# Patient Record
Sex: Female | Born: 1950 | Race: Black or African American | Hispanic: No | Marital: Single | Smoking: Current every day smoker
Health system: Southern US, Community
[De-identification: ages and names within clinical notes are randomized; demographics above are authoritative.]

## PROBLEM LIST (undated history)

## (undated) ENCOUNTER — Emergency Department (HOSPITAL_COMMUNITY): Discharge status: Absent without leave | Payer: Medicare Other

## (undated) DIAGNOSIS — C801 Malignant (primary) neoplasm, unspecified: Secondary | ICD-10-CM

## (undated) DIAGNOSIS — I219 Acute myocardial infarction, unspecified: Secondary | ICD-10-CM

## (undated) HISTORY — PX: CORONARY ANGIOPLASTY WITH STENT PLACEMENT: SHX49

## (undated) HISTORY — PX: ROTATOR CUFF REPAIR: SHX139

## (undated) HISTORY — PX: CARDIAC CATHETERIZATION: SHX172

---

## 1997-12-06 ENCOUNTER — Ambulatory Visit (HOSPITAL_COMMUNITY)
Admission: RE | Admit: 1997-12-06 | Discharge: 1997-12-07 | Payer: Self-pay | Admitting: Thoracic Surgery (Cardiothoracic Vascular Surgery)

## 1997-12-12 ENCOUNTER — Inpatient Hospital Stay (HOSPITAL_COMMUNITY)
Admission: RE | Admit: 1997-12-12 | Discharge: 1997-12-17 | Payer: Self-pay | Admitting: Thoracic Surgery (Cardiothoracic Vascular Surgery)

## 1997-12-17 ENCOUNTER — Inpatient Hospital Stay (HOSPITAL_COMMUNITY): Admission: EM | Admit: 1997-12-17 | Discharge: 1997-12-20 | Payer: Self-pay | Admitting: Emergency Medicine

## 1998-02-06 ENCOUNTER — Emergency Department (HOSPITAL_COMMUNITY): Admission: EM | Admit: 1998-02-06 | Discharge: 1998-02-06 | Payer: Self-pay | Admitting: Emergency Medicine

## 1998-04-01 ENCOUNTER — Ambulatory Visit (HOSPITAL_COMMUNITY)
Admission: RE | Admit: 1998-04-01 | Discharge: 1998-04-01 | Payer: Self-pay | Admitting: Thoracic Surgery (Cardiothoracic Vascular Surgery)

## 1998-05-12 ENCOUNTER — Emergency Department (HOSPITAL_COMMUNITY): Admission: EM | Admit: 1998-05-12 | Discharge: 1998-05-12 | Payer: Self-pay | Admitting: Emergency Medicine

## 1998-06-24 ENCOUNTER — Encounter: Payer: Self-pay | Admitting: *Deleted

## 1998-06-24 ENCOUNTER — Emergency Department (HOSPITAL_COMMUNITY): Admission: EM | Admit: 1998-06-24 | Discharge: 1998-06-24 | Payer: Self-pay | Admitting: *Deleted

## 1998-07-29 ENCOUNTER — Ambulatory Visit (HOSPITAL_BASED_OUTPATIENT_CLINIC_OR_DEPARTMENT_OTHER): Admission: RE | Admit: 1998-07-29 | Discharge: 1998-07-29 | Payer: Self-pay | Admitting: Orthopedic Surgery

## 1999-02-26 ENCOUNTER — Emergency Department (HOSPITAL_COMMUNITY): Admission: EM | Admit: 1999-02-26 | Discharge: 1999-02-26 | Payer: Self-pay | Admitting: Emergency Medicine

## 1999-02-27 ENCOUNTER — Encounter: Payer: Self-pay | Admitting: Emergency Medicine

## 1999-09-13 ENCOUNTER — Emergency Department (HOSPITAL_COMMUNITY): Admission: EM | Admit: 1999-09-13 | Discharge: 1999-09-13 | Payer: Self-pay | Admitting: Emergency Medicine

## 1999-09-13 ENCOUNTER — Encounter: Payer: Self-pay | Admitting: Emergency Medicine

## 1999-09-25 ENCOUNTER — Encounter: Admission: RE | Admit: 1999-09-25 | Discharge: 1999-09-25 | Payer: Self-pay | Admitting: Internal Medicine

## 1999-09-25 ENCOUNTER — Encounter: Payer: Self-pay | Admitting: Internal Medicine

## 2000-02-03 ENCOUNTER — Inpatient Hospital Stay (HOSPITAL_COMMUNITY): Admission: EM | Admit: 2000-02-03 | Discharge: 2000-02-05 | Payer: Self-pay | Admitting: Emergency Medicine

## 2000-02-03 ENCOUNTER — Encounter: Payer: Self-pay | Admitting: Emergency Medicine

## 2000-04-27 ENCOUNTER — Encounter: Payer: Self-pay | Admitting: *Deleted

## 2000-04-27 ENCOUNTER — Ambulatory Visit (HOSPITAL_COMMUNITY): Admission: RE | Admit: 2000-04-27 | Discharge: 2000-04-28 | Payer: Self-pay | Admitting: *Deleted

## 2000-05-11 ENCOUNTER — Encounter (HOSPITAL_COMMUNITY): Admission: RE | Admit: 2000-05-11 | Discharge: 2000-08-09 | Payer: Self-pay | Admitting: *Deleted

## 2000-09-01 ENCOUNTER — Encounter: Payer: Self-pay | Admitting: Emergency Medicine

## 2000-09-01 ENCOUNTER — Inpatient Hospital Stay (HOSPITAL_COMMUNITY): Admission: EM | Admit: 2000-09-01 | Discharge: 2000-09-03 | Payer: Self-pay

## 2000-09-01 ENCOUNTER — Encounter: Payer: Self-pay | Admitting: Internal Medicine

## 2000-09-02 ENCOUNTER — Encounter: Payer: Self-pay | Admitting: Internal Medicine

## 2000-09-28 ENCOUNTER — Other Ambulatory Visit: Admission: RE | Admit: 2000-09-28 | Discharge: 2000-09-28 | Payer: Self-pay | Admitting: Family Medicine

## 2001-01-17 ENCOUNTER — Inpatient Hospital Stay (HOSPITAL_COMMUNITY): Admission: EM | Admit: 2001-01-17 | Discharge: 2001-01-20 | Payer: Self-pay | Admitting: Emergency Medicine

## 2001-01-17 ENCOUNTER — Encounter: Payer: Self-pay | Admitting: Emergency Medicine

## 2001-01-18 ENCOUNTER — Encounter: Payer: Self-pay | Admitting: Cardiovascular Disease

## 2001-10-29 ENCOUNTER — Encounter: Payer: Self-pay | Admitting: Internal Medicine

## 2001-10-29 ENCOUNTER — Inpatient Hospital Stay (HOSPITAL_COMMUNITY): Admission: EM | Admit: 2001-10-29 | Discharge: 2001-11-01 | Payer: Self-pay

## 2001-10-30 ENCOUNTER — Encounter: Payer: Self-pay | Admitting: *Deleted

## 2001-10-31 ENCOUNTER — Encounter: Payer: Self-pay | Admitting: *Deleted

## 2002-11-05 ENCOUNTER — Encounter: Payer: Self-pay | Admitting: Cardiology

## 2002-11-05 ENCOUNTER — Inpatient Hospital Stay (HOSPITAL_COMMUNITY): Admission: EM | Admit: 2002-11-05 | Discharge: 2002-11-07 | Payer: Self-pay

## 2002-11-06 ENCOUNTER — Encounter (INDEPENDENT_AMBULATORY_CARE_PROVIDER_SITE_OTHER): Payer: Self-pay | Admitting: Cardiology

## 2003-07-17 ENCOUNTER — Ambulatory Visit (HOSPITAL_BASED_OUTPATIENT_CLINIC_OR_DEPARTMENT_OTHER): Admission: RE | Admit: 2003-07-17 | Discharge: 2003-07-17 | Payer: Self-pay | Admitting: Orthopedic Surgery

## 2003-07-17 ENCOUNTER — Ambulatory Visit (HOSPITAL_COMMUNITY): Admission: RE | Admit: 2003-07-17 | Discharge: 2003-07-17 | Payer: Self-pay | Admitting: Orthopedic Surgery

## 2004-08-11 ENCOUNTER — Inpatient Hospital Stay (HOSPITAL_COMMUNITY): Admission: EM | Admit: 2004-08-11 | Discharge: 2004-08-13 | Payer: Self-pay | Admitting: Emergency Medicine

## 2005-04-14 ENCOUNTER — Ambulatory Visit (HOSPITAL_BASED_OUTPATIENT_CLINIC_OR_DEPARTMENT_OTHER): Admission: RE | Admit: 2005-04-14 | Discharge: 2005-04-14 | Payer: Self-pay | Admitting: *Deleted

## 2005-04-19 ENCOUNTER — Ambulatory Visit: Payer: Self-pay | Admitting: Internal Medicine

## 2005-04-20 ENCOUNTER — Inpatient Hospital Stay (HOSPITAL_COMMUNITY): Admission: EM | Admit: 2005-04-20 | Discharge: 2005-04-21 | Payer: Self-pay | Admitting: Emergency Medicine

## 2005-12-29 ENCOUNTER — Emergency Department (HOSPITAL_COMMUNITY): Admission: EM | Admit: 2005-12-29 | Discharge: 2005-12-29 | Payer: Self-pay | Admitting: Family Medicine

## 2007-04-06 ENCOUNTER — Emergency Department (HOSPITAL_COMMUNITY): Admission: EM | Admit: 2007-04-06 | Discharge: 2007-04-06 | Payer: Self-pay | Admitting: Emergency Medicine

## 2008-08-31 ENCOUNTER — Emergency Department (HOSPITAL_COMMUNITY): Admission: EM | Admit: 2008-08-31 | Discharge: 2008-09-01 | Payer: Self-pay | Admitting: Emergency Medicine

## 2008-12-07 ENCOUNTER — Inpatient Hospital Stay (HOSPITAL_COMMUNITY): Admission: EM | Admit: 2008-12-07 | Discharge: 2008-12-11 | Payer: Self-pay | Admitting: Emergency Medicine

## 2008-12-07 ENCOUNTER — Ambulatory Visit: Payer: Self-pay | Admitting: Internal Medicine

## 2008-12-08 ENCOUNTER — Ambulatory Visit: Payer: Self-pay | Admitting: *Deleted

## 2008-12-22 ENCOUNTER — Emergency Department (HOSPITAL_COMMUNITY): Admission: EM | Admit: 2008-12-22 | Discharge: 2008-12-22 | Payer: Self-pay | Admitting: Emergency Medicine

## 2009-01-09 ENCOUNTER — Ambulatory Visit: Payer: Self-pay | Admitting: Internal Medicine

## 2009-01-09 ENCOUNTER — Inpatient Hospital Stay (HOSPITAL_COMMUNITY): Admission: EM | Admit: 2009-01-09 | Discharge: 2009-01-15 | Payer: Self-pay | Admitting: Emergency Medicine

## 2009-03-21 ENCOUNTER — Emergency Department (HOSPITAL_COMMUNITY): Admission: EM | Admit: 2009-03-21 | Discharge: 2009-03-21 | Payer: Self-pay | Admitting: Emergency Medicine

## 2010-04-02 IMAGING — CT CT HEAD W/O CM
1 series · 16 of 30 positions shown, 20 images · non-contrast
Comparison: None

CLINICAL DATA: Difficult to arouse status post extubation

CT HEAD WITHOUT CONTRAST
TECHNIQUE: Contiguous axial images were obtained from the base of
the skull through the vertex without contrast.

[Series 2: head routine 4.8 h37s · axial · 0.46mm/px · z∈[-91,+74]mm · 16 of 36 slices shown, 20 images]
[im 2/36  brain]
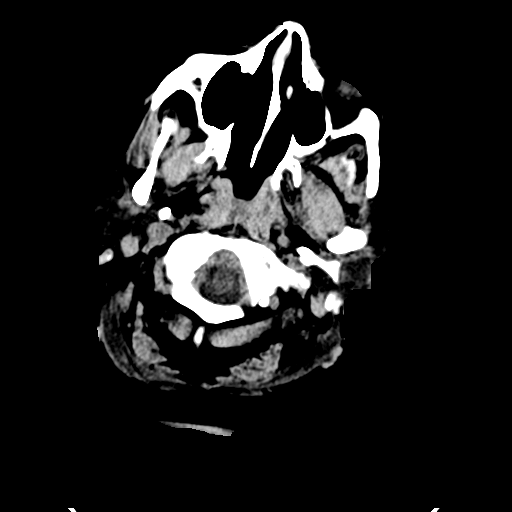
[im 2/36  bone]
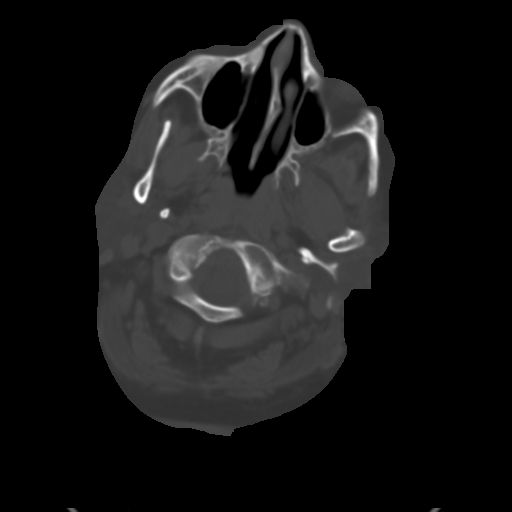
[im 4/36  brain]
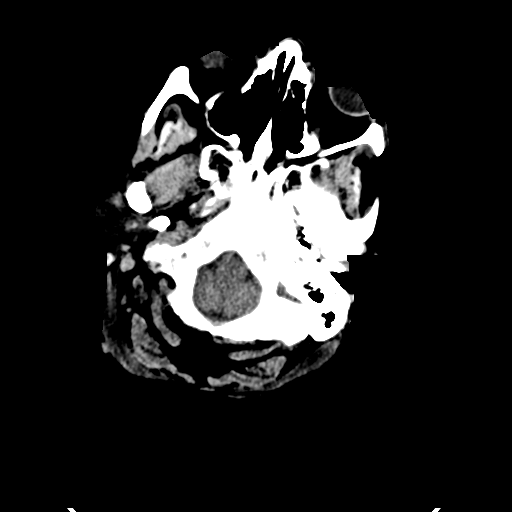
[im 7/36  brain]
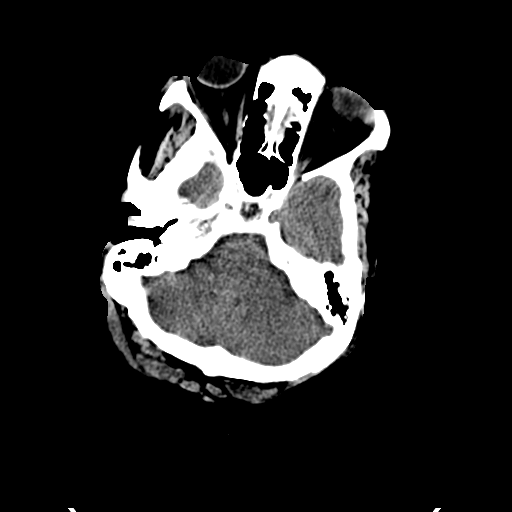
[im 9/36  brain]
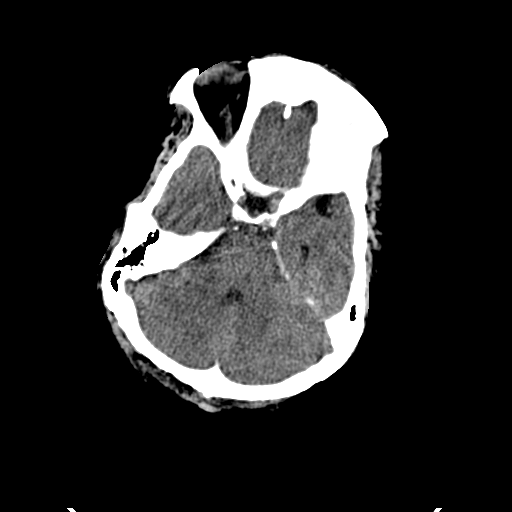
[im 10/36  brain]
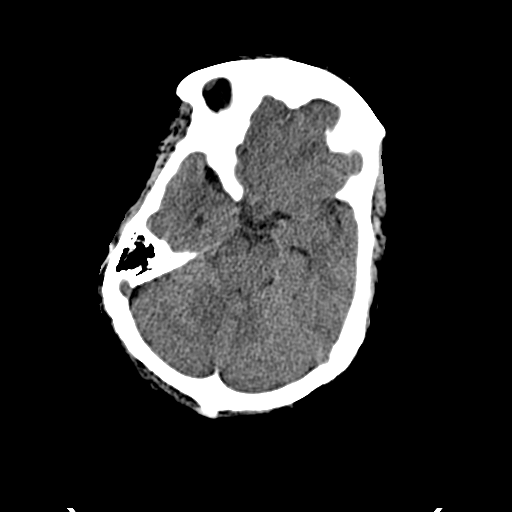
[im 10/36  bone]
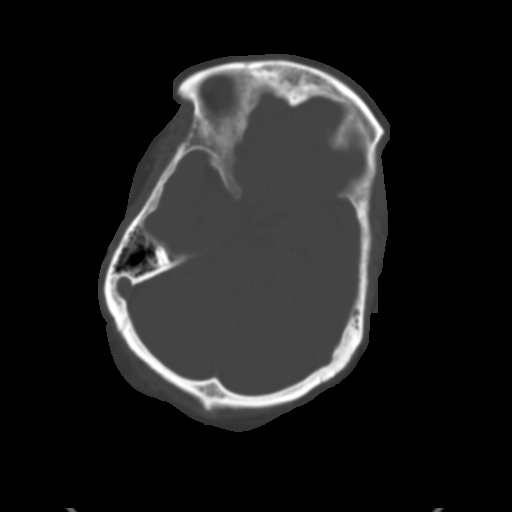
[im 13/36  brain]
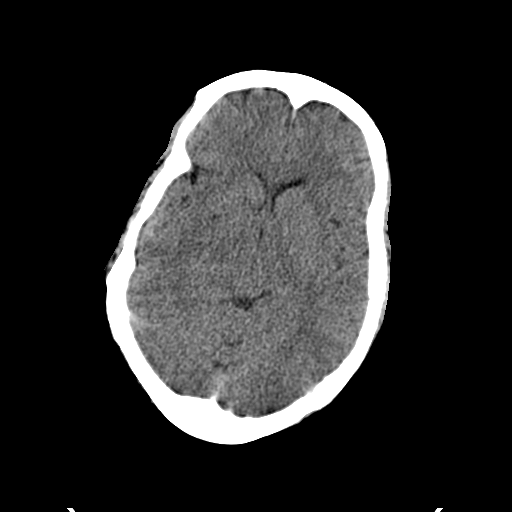
[im 15/36  brain]
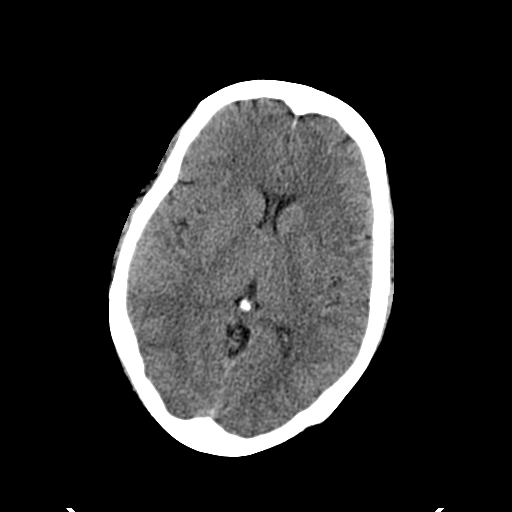
[im 17/36  brain]
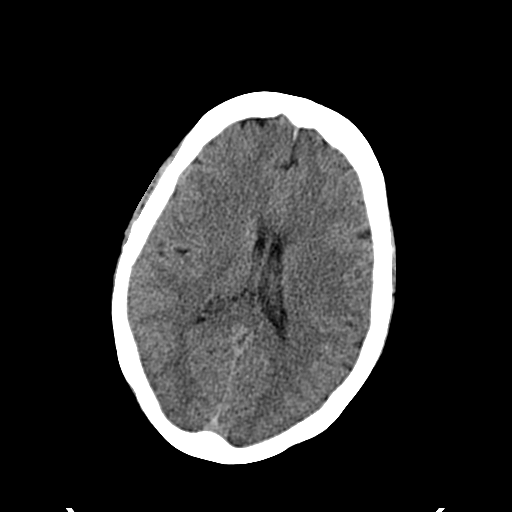
[im 19/36  brain]
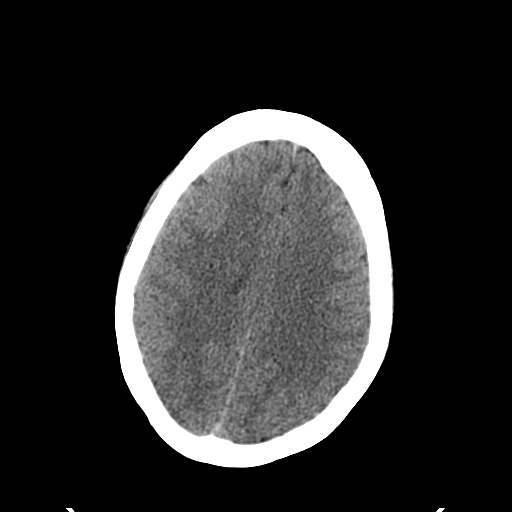
[im 19/36  bone]
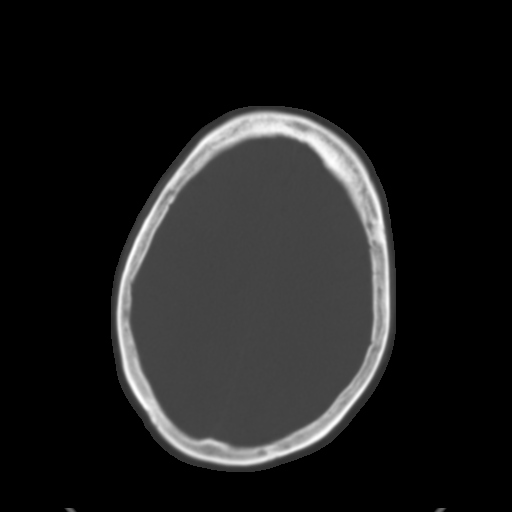
[im 21/36  brain]
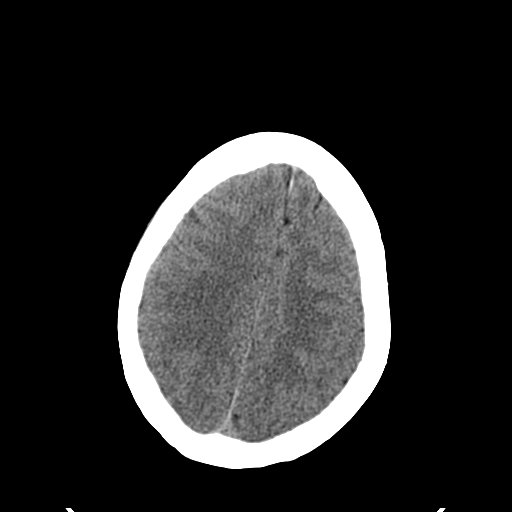
[im 23/36  brain]
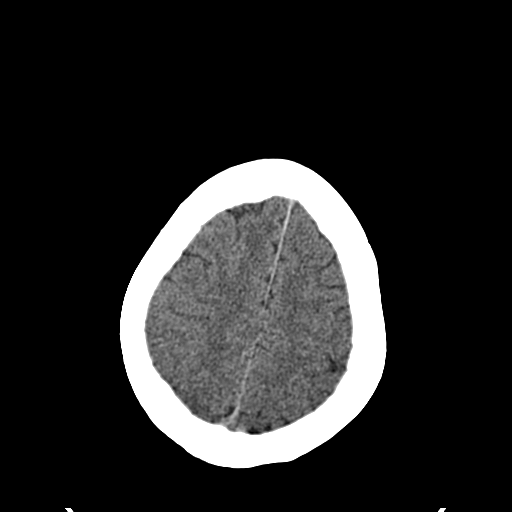
[im 26/36  brain]
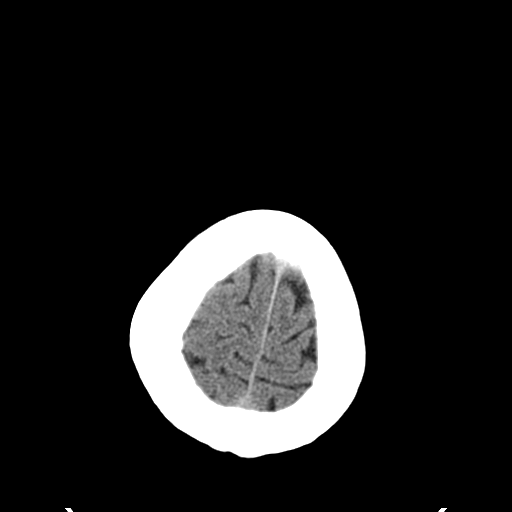
[im 27/36  brain]
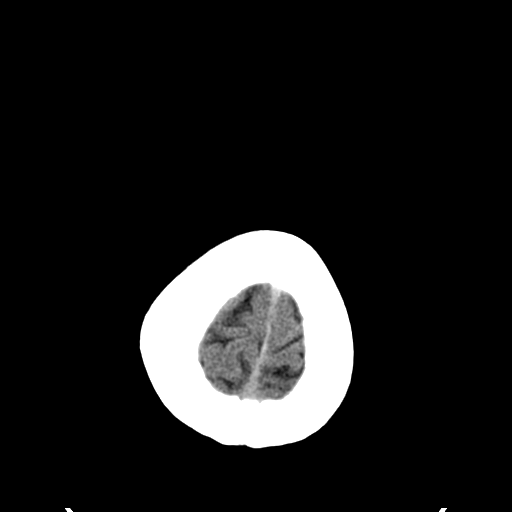
[im 27/36  bone]
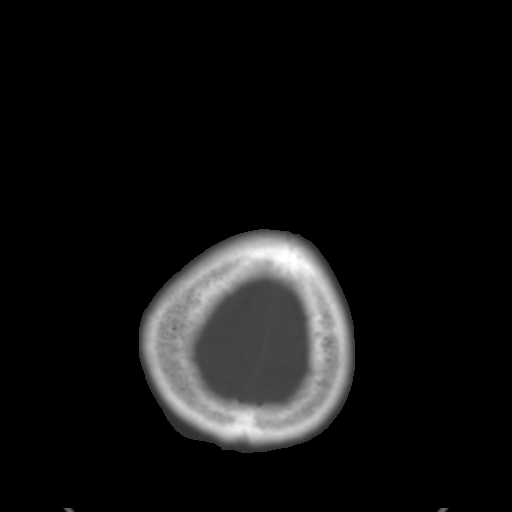
[im 29/36  brain]
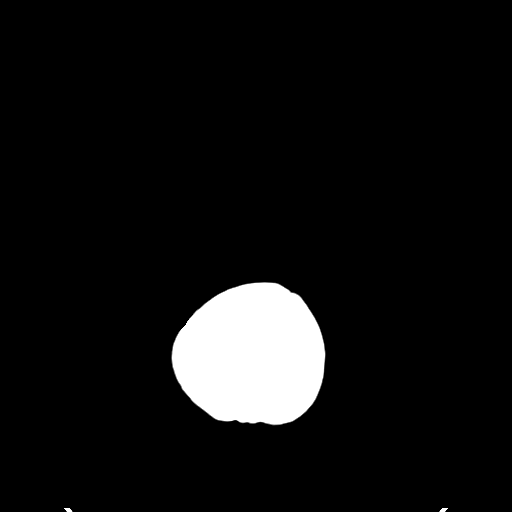
[im 32/36  brain]
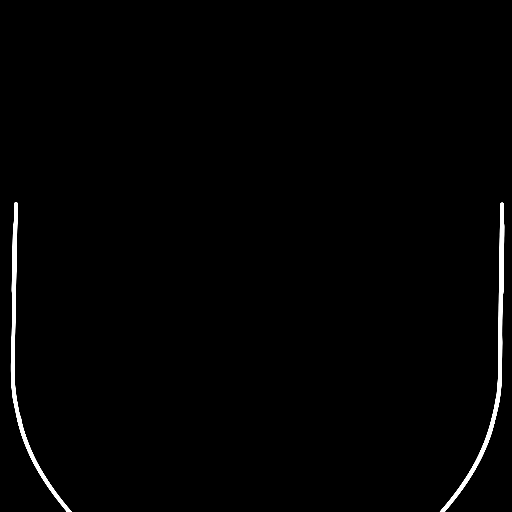
[im 34/36  brain]
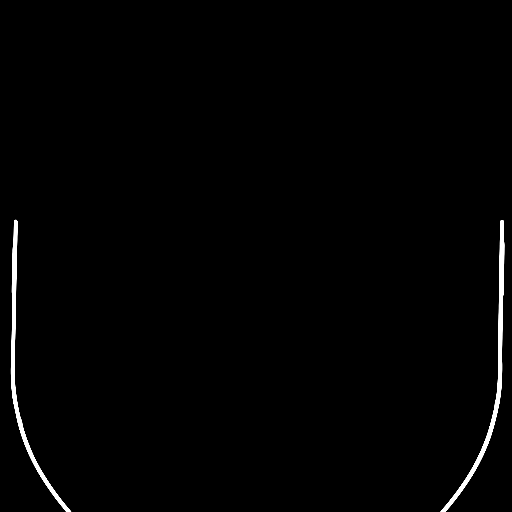

[16 of 30 positions shown; findings below may reference images not displayed]

FINDINGS: No extra-axial fluid collections or intraparenchymal
hemorrhage.  No midline shift.  No mass lesion.  No CT evidence of
acute infarction.  No hydrocephalous.  Basilar cisterns are patent.

Paranasal sinuses and mastoid air cells are clear.  Orbits appear
normal.
IMPRESSION: 1.  No acute intracranial process.

Critical test results telephoned to [REDACTED], RN at the time of
interpretation on 01/10/2009 at [DATE].

## 2010-04-04 IMAGING — CR DG CHEST 1V PORT
1 series · 1 of 1 positions shown · non-contrast
Comparison: 01/11/2009

CLINICAL DATA: Fever, rales, smoker.

PORTABLE CHEST - 1 VIEW

[view not recorded]
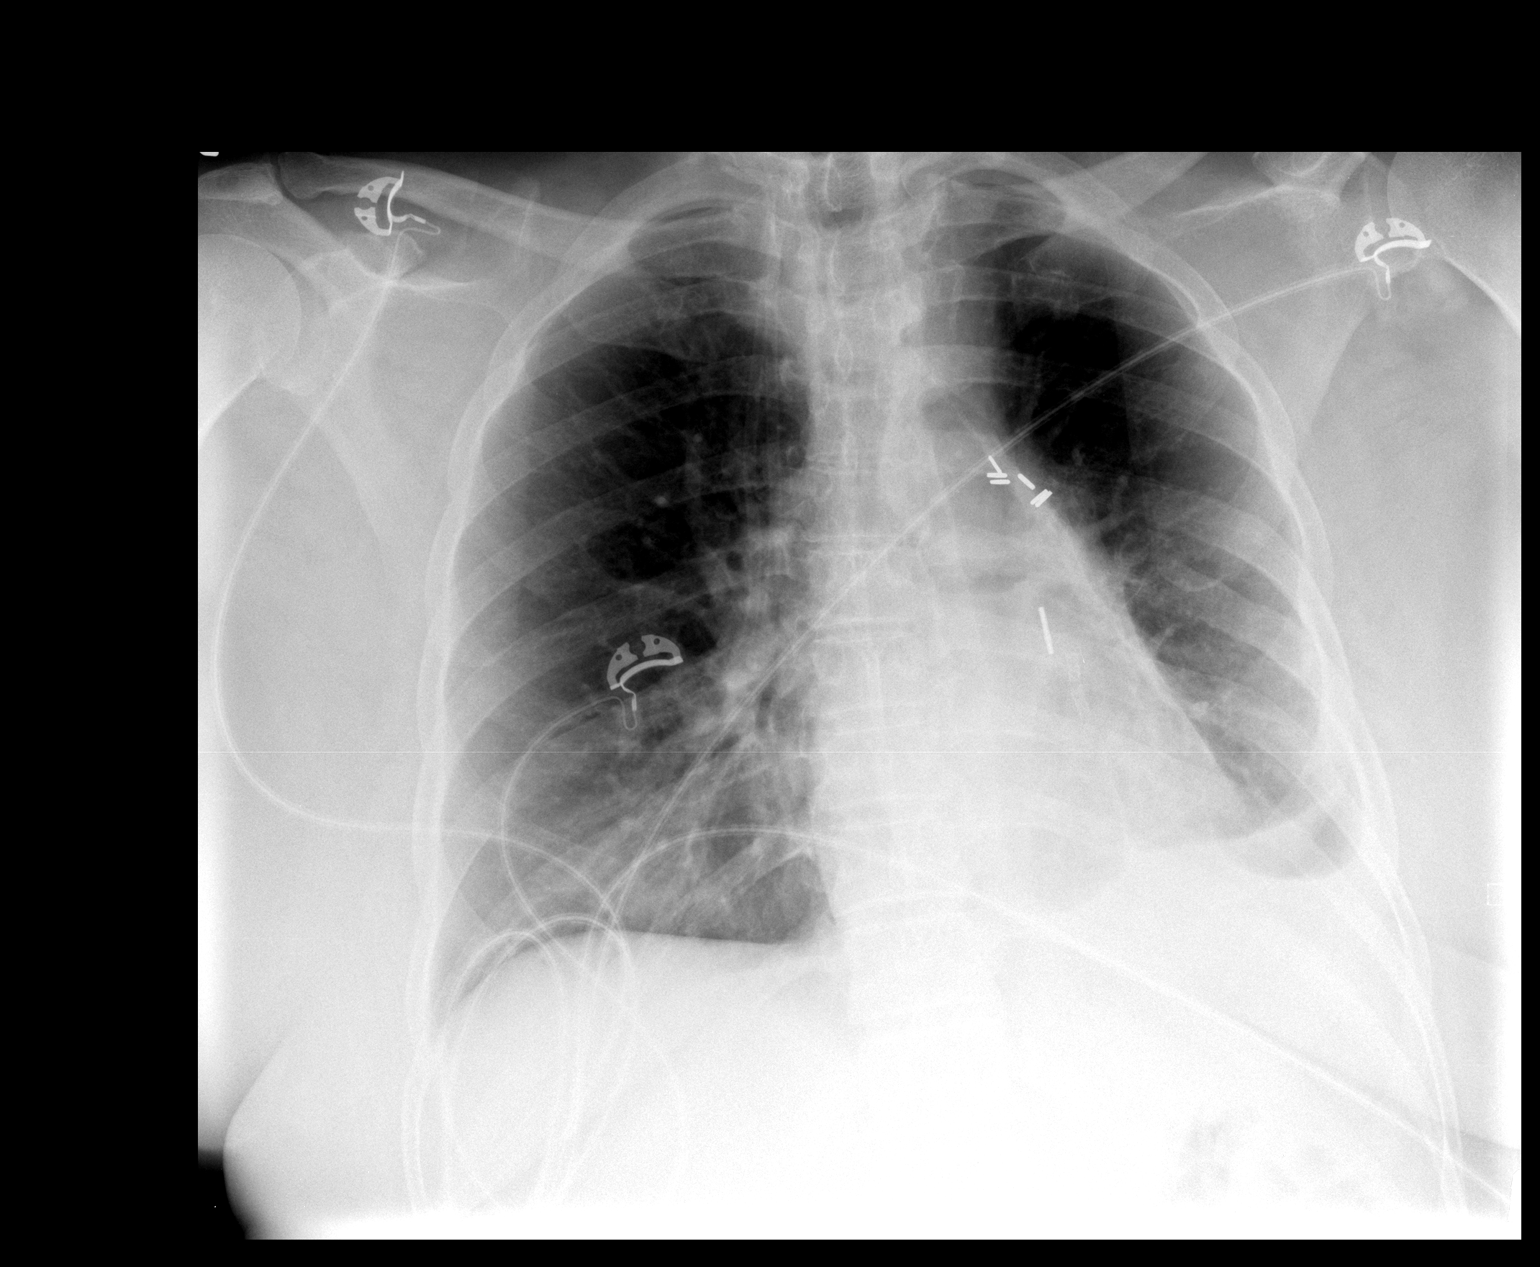

[1 of 1 positions shown; findings below may reference images not displayed]

FINDINGS: The cardiac silhouette, mediastinal and hilar contours
are stable.  There is a persistent small left effusion with
overlying atelectasis.  Persistent streaky right lower lobe
atelectasis.  No pulmonary edema.
IMPRESSION: 1.  Persistent small left effusion with overlying atelectasis.
2.  Persistent streaky right basilar atelectasis.  Slight overall
improved perihilar aeration.

## 2010-04-06 IMAGING — CR DG CHEST 2V
2 series · 2 of 2 positions shown · non-contrast
Comparison: 01/22/2009

CLINICAL DATA: Chest tightness/pneumonia

CHEST - 2 VIEW

[w chest pa]
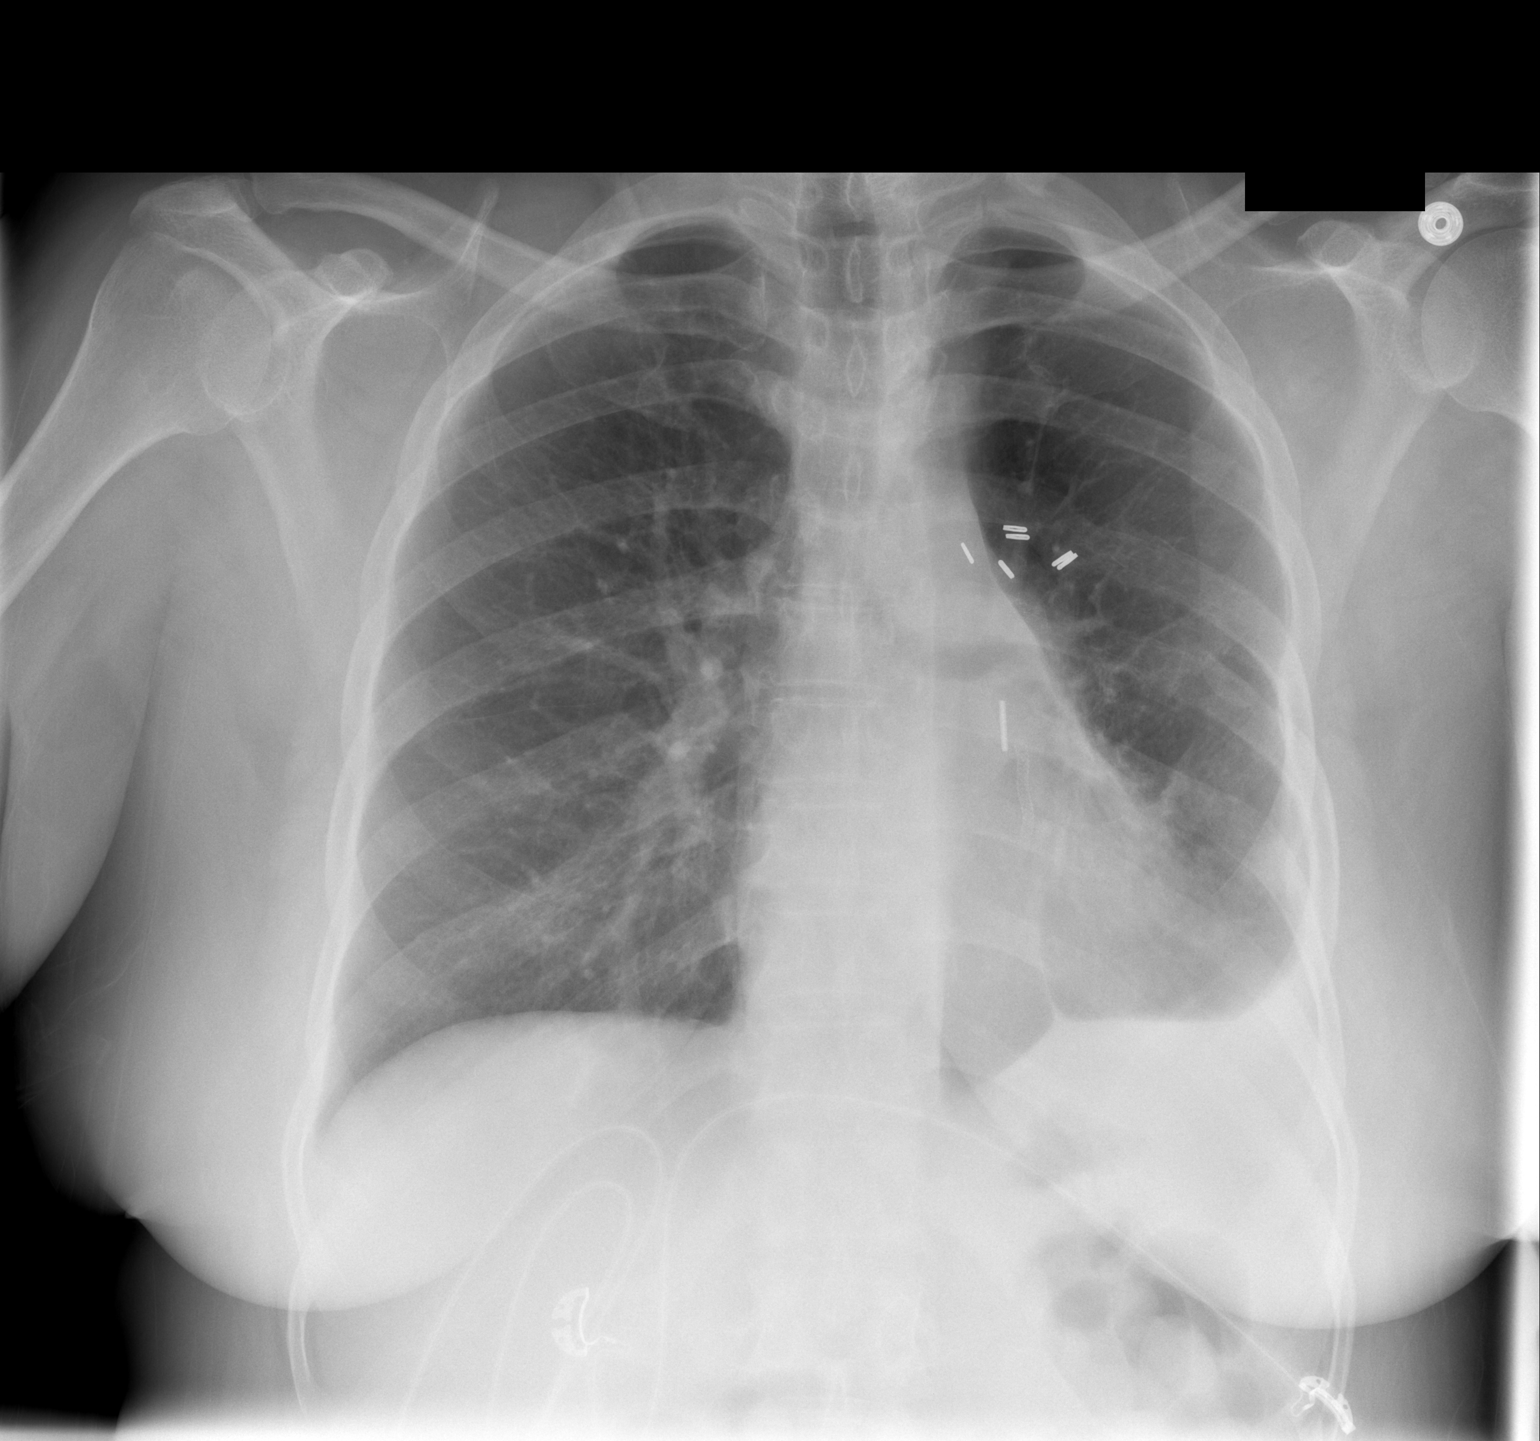

[w chest lat]
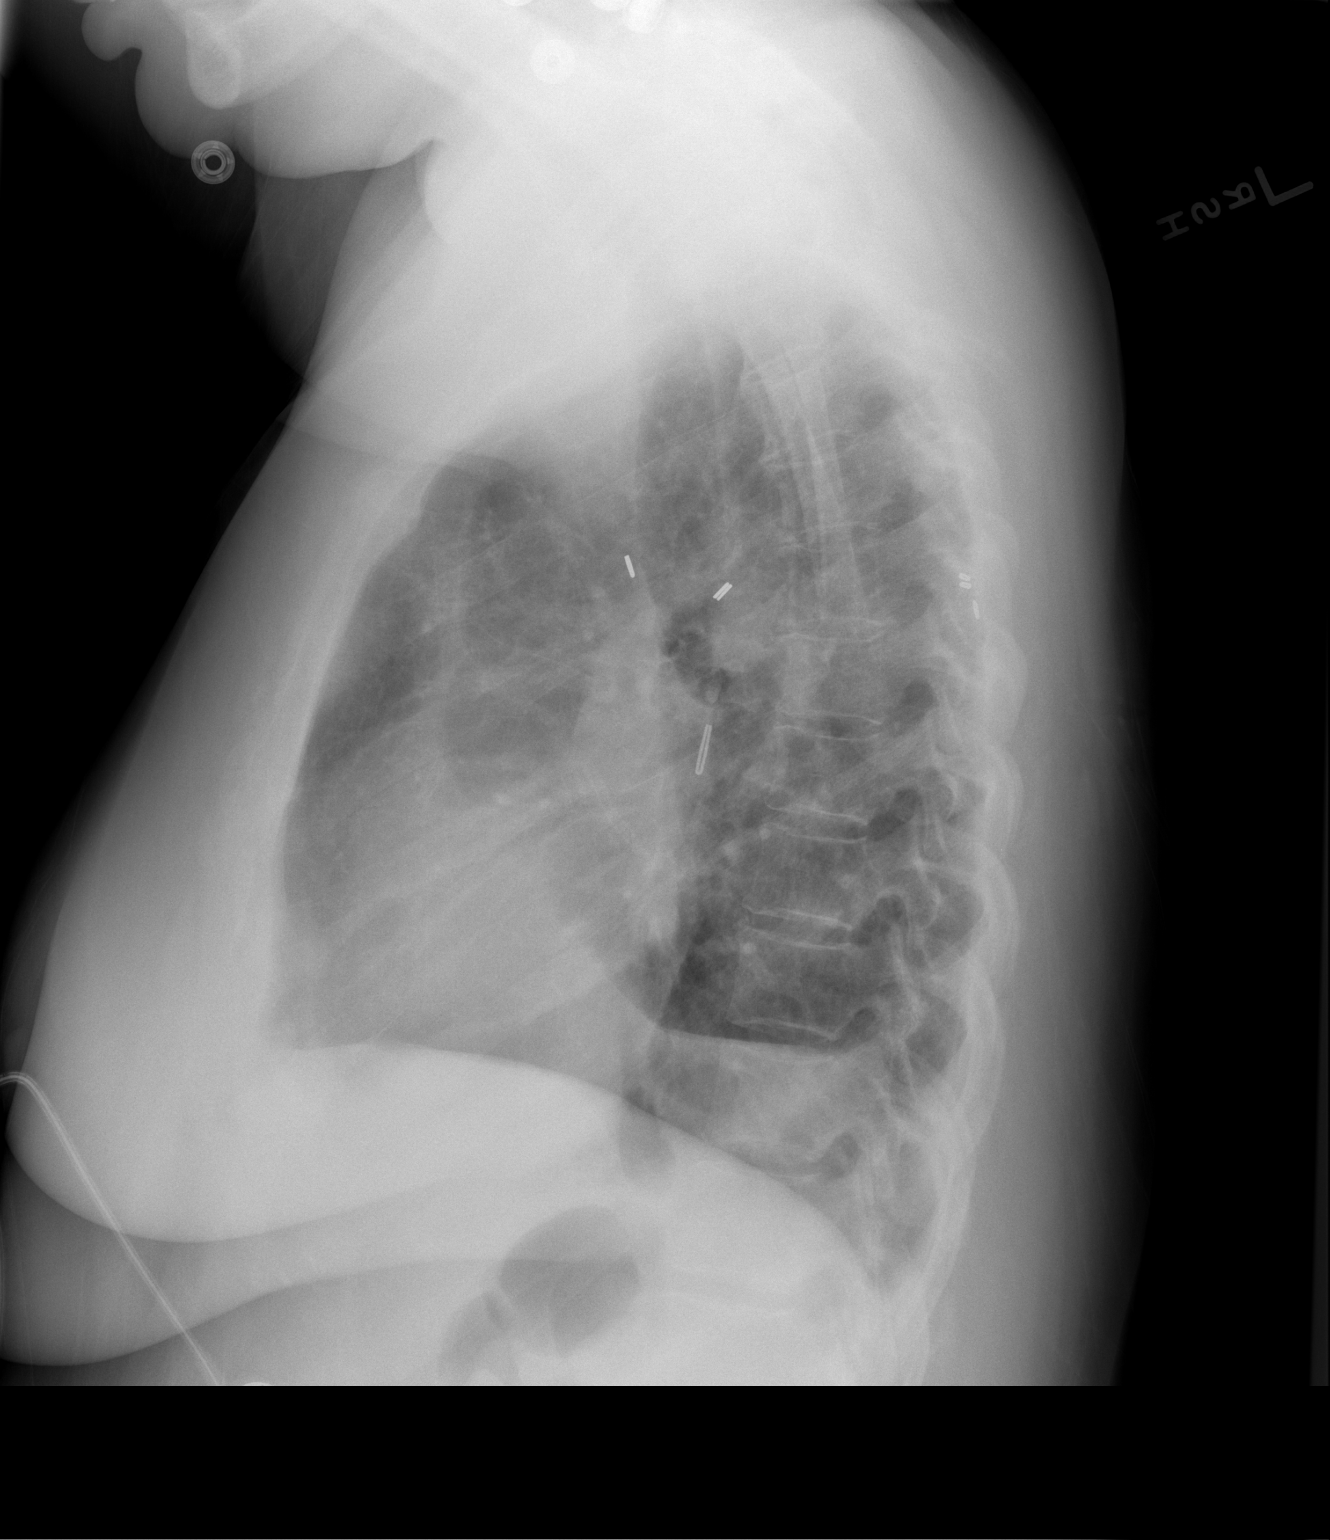

[2 of 2 positions shown; findings below may reference images not displayed]

FINDINGS: Persistent left pleural effusion without definite
pneumonia or heart failure. There is minimal atelectasis or scar at
the left lung base. Coronary arteries stents noted.  Heart size
normal without congestive heart failure.
IMPRESSION: 1.  Persistent left pleural effusion.
2.  No definite active pneumonia or heart failure.

## 2010-12-16 LAB — DIFFERENTIAL
Basophils Absolute: 0 10*3/uL (ref 0.0–0.1)
Basophils Absolute: 0.1 10*3/uL (ref 0.0–0.1)
Basophils Relative: 0 % (ref 0–1)
Eosinophils Absolute: 0.1 10*3/uL (ref 0.0–0.7)
Lymphs Abs: 3.8 10*3/uL (ref 0.7–4.0)
Monocytes Absolute: 0.5 10*3/uL (ref 0.1–1.0)
Monocytes Relative: 6 % (ref 3–12)
Monocytes Relative: 9 % (ref 3–12)
Neutrophils Relative %: 52 % (ref 43–77)
Neutrophils Relative %: 71 % (ref 43–77)

## 2010-12-16 LAB — BASIC METABOLIC PANEL
BUN: 7 mg/dL (ref 6–23)
CO2: 20 mEq/L (ref 19–32)
CO2: 22 mEq/L (ref 19–32)
CO2: 25 mEq/L (ref 19–32)
CO2: 28 mEq/L (ref 19–32)
Calcium: 8.4 mg/dL (ref 8.4–10.5)
Calcium: 8.6 mg/dL (ref 8.4–10.5)
Chloride: 102 mEq/L (ref 96–112)
Chloride: 108 mEq/L (ref 96–112)
Creatinine, Ser: 0.64 mg/dL (ref 0.4–1.2)
GFR calc Af Amer: >60 mL/min (ref >60)
GFR calc Af Amer: >60 mL/min (ref >60)
GFR calc Af Amer: >60 mL/min (ref >60)
GFR calc non Af Amer: >60 mL/min (ref >60)
GFR calc non Af Amer: >60 mL/min (ref >60)
GFR calc non Af Amer: >60 mL/min (ref >60)
Glucose, Bld: 107 mg/dL — ABNORMAL HIGH (ref 70–99)
Glucose, Bld: 113 mg/dL — ABNORMAL HIGH (ref 70–99)
Glucose, Bld: 114 mg/dL — ABNORMAL HIGH (ref 70–99)
Potassium: 3.3 mEq/L — ABNORMAL LOW (ref 3.5–5.1)
Potassium: 3.3 mEq/L — ABNORMAL LOW (ref 3.5–5.1)
Potassium: 3.7 mEq/L (ref 3.5–5.1)
Sodium: 135 mEq/L (ref 135–145)
Sodium: 138 mEq/L (ref 135–145)
Sodium: 139 mEq/L (ref 135–145)
Sodium: 139 mEq/L (ref 135–145)

## 2010-12-16 LAB — CBC
HCT: 30.1 % — ABNORMAL LOW (ref 36.0–46.0)
HCT: 33.5 % — ABNORMAL LOW (ref 36.0–46.0)
HCT: 36.1 % (ref 36.0–46.0)
Hemoglobin: 10.6 g/dL — ABNORMAL LOW (ref 12.0–15.0)
Hemoglobin: 11.1 g/dL — ABNORMAL LOW (ref 12.0–15.0)
Hemoglobin: 11.7 g/dL — ABNORMAL LOW (ref 12.0–15.0)
Hemoglobin: 12 g/dL (ref 12.0–15.0)
MCHC: 34.6 g/dL (ref 30.0–36.0)
MCHC: 34.7 g/dL (ref 30.0–36.0)
MCHC: 34.8 g/dL (ref 30.0–36.0)
MCHC: 34.8 g/dL (ref 30.0–36.0)
MCHC: 35.2 g/dL (ref 30.0–36.0)
MCV: 95.1 fL (ref 78.0–100.0)
MCV: 95.2 fL (ref 78.0–100.0)
MCV: 95.2 fL (ref 78.0–100.0)
MCV: 95.4 fL (ref 78.0–100.0)
MCV: 96 fL (ref 78.0–100.0)
Platelets: 220 10*3/uL (ref 150–400)
Platelets: 238 10*3/uL (ref 150–400)
Platelets: 352 10*3/uL (ref 150–400)
Platelets: 387 10*3/uL (ref 150–400)
RBC: 3.34 MIL/uL — ABNORMAL LOW (ref 3.87–5.11)
RBC: 3.49 MIL/uL — ABNORMAL LOW (ref 3.87–5.11)
RBC: 3.51 MIL/uL — ABNORMAL LOW (ref 3.87–5.11)
RBC: 3.78 MIL/uL — ABNORMAL LOW (ref 3.87–5.11)
RDW: 13.3 % (ref 11.5–15.5)
RDW: 13.4 % (ref 11.5–15.5)
RDW: 13.5 % (ref 11.5–15.5)
RDW: 13.5 % (ref 11.5–15.5)
RDW: 13.6 % (ref 11.5–15.5)
RDW: 13.8 % (ref 11.5–15.5)
WBC: 8.2 10*3/uL (ref 4.0–10.5)

## 2010-12-16 LAB — POCT I-STAT 3, ART BLOOD GAS (G3+)
Acid-base deficit: 3 mmol/L — ABNORMAL HIGH (ref 0.0–2.0)
Acid-base deficit: 5 mmol/L — ABNORMAL HIGH (ref 0.0–2.0)
Acid-base deficit: 5 mmol/L — ABNORMAL HIGH (ref 0.0–2.0)
Bicarbonate: 20.7 mEq/L (ref 20.0–24.0)
Bicarbonate: 21.3 mEq/L (ref 20.0–24.0)
Bicarbonate: 21.5 mEq/L (ref 20.0–24.0)
O2 Saturation: 100 %
O2 Saturation: 98 %
Patient temperature: 98
Patient temperature: 98
Patient temperature: 99.2
Patient temperature: 99.3
TCO2: 22 mmol/L (ref 0–100)
TCO2: 24 mmol/L (ref 0–100)
pCO2 arterial: 35.7 mmHg (ref 35.0–45.0)
pH, Arterial: 7.27 — ABNORMAL LOW (ref 7.350–7.400)
pH, Arterial: 7.385 (ref 7.350–7.400)
pH, Arterial: 7.385 (ref 7.350–7.400)
pO2, Arterial: 110 mmHg — ABNORMAL HIGH (ref 80.0–100.0)

## 2010-12-16 LAB — TROPONIN I
Troponin I: 0.58 ng/mL (ref 0.00–0.06)
Troponin I: 12.17 ng/mL (ref 0.00–0.06)

## 2010-12-16 LAB — CARDIAC PANEL(CRET KIN+CKTOT+MB+TROPI)
CK, MB: 2.6 ng/mL (ref 0.3–4.0)
Total CK: 137 U/L (ref 7–177)
Total CK: 45 U/L (ref 7–177)
Total CK: 45 U/L (ref 7–177)

## 2010-12-16 LAB — CK TOTAL AND CKMB (NOT AT ARMC)
CK, MB: 143.6 ng/mL — ABNORMAL HIGH (ref 0.3–4.0)
CK, MB: 8.1 ng/mL — ABNORMAL HIGH (ref 0.3–4.0)
Relative Index: 20.4 — ABNORMAL HIGH (ref 0.0–2.5)
Relative Index: 20.5 — ABNORMAL HIGH (ref 0.0–2.5)
Total CK: 75 U/L (ref 7–177)

## 2010-12-16 LAB — COMPREHENSIVE METABOLIC PANEL
AST: 21 U/L (ref 0–37)
Albumin: 3.3 g/dL — ABNORMAL LOW (ref 3.5–5.2)
BUN: 5 mg/dL — ABNORMAL LOW (ref 6–23)
Chloride: 109 mEq/L (ref 96–112)
Chloride: 110 mEq/L (ref 96–112)
Creatinine, Ser: 0.71 mg/dL (ref 0.4–1.2)
GFR calc Af Amer: >60 mL/min (ref >60)
GFR calc non Af Amer: >60 mL/min (ref >60)
Potassium: 3.2 mEq/L — ABNORMAL LOW (ref 3.5–5.1)
Sodium: 140 mEq/L (ref 135–145)
Total Bilirubin: 0.5 mg/dL (ref 0.3–1.2)
Total Bilirubin: 0.9 mg/dL (ref 0.3–1.2)
Total Protein: 6 g/dL (ref 6.0–8.3)

## 2010-12-16 LAB — WET PREP, GENITAL: Trich, Wet Prep: NONE SEEN

## 2010-12-16 LAB — GLUCOSE, CAPILLARY
Glucose-Capillary: 103 mg/dL — ABNORMAL HIGH (ref 70–99)
Glucose-Capillary: 106 mg/dL — ABNORMAL HIGH (ref 70–99)
Glucose-Capillary: 113 mg/dL — ABNORMAL HIGH (ref 70–99)
Glucose-Capillary: 119 mg/dL — ABNORMAL HIGH (ref 70–99)
Glucose-Capillary: 163 mg/dL — ABNORMAL HIGH (ref 70–99)
Glucose-Capillary: 203 mg/dL — ABNORMAL HIGH (ref 70–99)
Glucose-Capillary: 82 mg/dL (ref 70–99)
Glucose-Capillary: 87 mg/dL (ref 70–99)
Glucose-Capillary: 88 mg/dL (ref 70–99)
Glucose-Capillary: 97 mg/dL (ref 70–99)
Glucose-Capillary: 98 mg/dL (ref 70–99)
Glucose-Capillary: 98 mg/dL (ref 70–99)

## 2010-12-16 LAB — CULTURE, BLOOD (ROUTINE X 2): Culture: NO GROWTH

## 2010-12-16 LAB — URINE CULTURE: Colony Count: >=100000

## 2010-12-16 LAB — PROTIME-INR: Prothrombin Time: 13.7 seconds (ref 11.6–15.2)

## 2010-12-16 LAB — MAGNESIUM: Magnesium: 1.6 mg/dL (ref 1.5–2.5)

## 2010-12-16 LAB — BETA-HYDROXYBUTYRIC ACID: Beta-Hydroxybutyric Acid: 0.4 mg/dL (ref 0.0–3.0)

## 2010-12-16 LAB — D-DIMER, QUANTITATIVE: D-Dimer, Quant: 0.46 ug/mL-FEU (ref 0.00–0.48)

## 2010-12-17 LAB — PLATELET COUNT: Platelets: 374 10*3/uL (ref 150–400)

## 2010-12-17 LAB — BASIC METABOLIC PANEL
BUN: 15 mg/dL (ref 6–23)
CO2: 29 mEq/L (ref 19–32)
CO2: 29 mEq/L (ref 19–32)
Calcium: 8.2 mg/dL — ABNORMAL LOW (ref 8.4–10.5)
Calcium: 8.6 mg/dL (ref 8.4–10.5)
Calcium: 8.7 mg/dL (ref 8.4–10.5)
Creatinine, Ser: 0.58 mg/dL (ref 0.4–1.2)
Creatinine, Ser: 0.7 mg/dL (ref 0.4–1.2)
GFR calc Af Amer: >60 mL/min (ref >60)
GFR calc Af Amer: >60 mL/min (ref >60)
GFR calc non Af Amer: >60 mL/min (ref >60)
GFR calc non Af Amer: >60 mL/min (ref >60)
Glucose, Bld: 116 mg/dL — ABNORMAL HIGH (ref 70–99)
Glucose, Bld: 182 mg/dL — ABNORMAL HIGH (ref 70–99)
Glucose, Bld: 197 mg/dL — ABNORMAL HIGH (ref 70–99)
Potassium: 4 mEq/L (ref 3.5–5.1)
Sodium: 136 mEq/L (ref 135–145)
Sodium: 140 mEq/L (ref 135–145)

## 2010-12-17 LAB — CBC
HCT: 37.9 % (ref 36.0–46.0)
HCT: 38 % (ref 36.0–46.0)
HCT: 40 % (ref 36.0–46.0)
HCT: 40.8 % (ref 36.0–46.0)
HCT: 41.3 % (ref 36.0–46.0)
Hemoglobin: 13.3 g/dL (ref 12.0–15.0)
Hemoglobin: 13.4 g/dL (ref 12.0–15.0)
Hemoglobin: 13.5 g/dL (ref 12.0–15.0)
Hemoglobin: 13.8 g/dL (ref 12.0–15.0)
MCHC: 34.4 g/dL (ref 30.0–36.0)
MCHC: 34.9 g/dL (ref 30.0–36.0)
MCHC: 35 g/dL (ref 30.0–36.0)
MCHC: 35.2 g/dL (ref 30.0–36.0)
MCHC: 35.5 g/dL (ref 30.0–36.0)
MCHC: 35.6 g/dL (ref 30.0–36.0)
MCV: 94.6 fL (ref 78.0–100.0)
MCV: 95.1 fL (ref 78.0–100.0)
MCV: 95.2 fL (ref 78.0–100.0)
MCV: 96.6 fL (ref 78.0–100.0)
Platelets: 311 10*3/uL (ref 150–400)
Platelets: 355 K/uL (ref 150–400)
Platelets: 360 10*3/uL (ref 150–400)
RBC: 3.99 MIL/uL (ref 3.87–5.11)
RDW: 12.9 % (ref 11.5–15.5)
RDW: 13.3 % (ref 11.5–15.5)
RDW: 13.3 % (ref 11.5–15.5)
RDW: 13.4 % (ref 11.5–15.5)
RDW: 13.4 % (ref 11.5–15.5)
WBC: 21.3 K/uL — ABNORMAL HIGH (ref 4.0–10.5)
WBC: 9.3 10*3/uL (ref 4.0–10.5)

## 2010-12-17 LAB — CARDIAC PANEL(CRET KIN+CKTOT+MB+TROPI)
CK, MB: 115.5 ng/mL — ABNORMAL HIGH (ref 0.3–4.0)
CK, MB: 40.1 ng/mL — ABNORMAL HIGH (ref 0.3–4.0)
Relative Index: 16 — ABNORMAL HIGH (ref 0.0–2.5)
Total CK: 722 U/L — ABNORMAL HIGH (ref 7–177)

## 2010-12-17 LAB — DIFFERENTIAL
Eosinophils Absolute: 0 10*3/uL (ref 0.0–0.7)
Eosinophils Relative: 0 % (ref 0–5)
Lymphs Abs: 1.1 10*3/uL (ref 0.7–4.0)
Monocytes Absolute: 0.3 10*3/uL (ref 0.1–1.0)
Monocytes Relative: 4 % (ref 3–12)

## 2010-12-17 LAB — URINALYSIS, ROUTINE W REFLEX MICROSCOPIC
Bilirubin Urine: NEGATIVE
Glucose, UA: NEGATIVE mg/dL
Hgb urine dipstick: NEGATIVE
Ketones, ur: NEGATIVE mg/dL
pH: 6.5 (ref 5.0–8.0)

## 2010-12-17 LAB — POCT CARDIAC MARKERS
Myoglobin, poc: 272 ng/mL (ref 12–200)
Myoglobin, poc: 412 ng/mL (ref 12–200)

## 2010-12-17 LAB — URINE MICROSCOPIC-ADD ON

## 2010-12-17 LAB — D-DIMER, QUANTITATIVE: D-Dimer, Quant: 0.33 ug/mL-FEU (ref 0.00–0.48)

## 2010-12-17 LAB — LIPID PANEL
Cholesterol: 109 mg/dL (ref 0–200)
HDL: 31 mg/dL — ABNORMAL LOW (ref >39)
LDL Cholesterol: 71 mg/dL (ref 0–99)
Total CHOL/HDL Ratio: 3.5 RATIO

## 2010-12-17 LAB — PROTIME-INR: Prothrombin Time: 14.1 seconds (ref 11.6–15.2)

## 2010-12-17 LAB — HEPARIN LEVEL (UNFRACTIONATED): Heparin Unfractionated: <0.1 IU/mL — ABNORMAL LOW (ref 0.30–0.70)

## 2010-12-17 LAB — BRAIN NATRIURETIC PEPTIDE: Pro B Natriuretic peptide (BNP): 168 pg/mL — ABNORMAL HIGH (ref 0.0–100.0)

## 2010-12-17 LAB — CK TOTAL AND CKMB (NOT AT ARMC): CK, MB: 26.5 ng/mL — ABNORMAL HIGH (ref 0.3–4.0)

## 2010-12-17 LAB — TROPONIN I: Troponin I: 0.72 ng/mL (ref 0.00–0.06)

## 2011-01-20 NOTE — H&P (Signed)
[name], [name]                ACCOUNT NO.:  [id number]   MEDICAL RECORD NO.:  [id number]          PATIENT TYPE:  INP   LOCATION:  1824                         FACILITY:  [facility]   PHYSICIAN:  Valerie A. Felicity Coyer, MDDATE OF BIRTH:  [birth year]   DATE OF ADMISSION:  12/06/2008  DATE OF DISCHARGE:                              HISTORY & PHYSICAL   PRIMARY CARE PHYSICIAN:  Reportedly Lerry Liner, MD   CARDIOLOGY:  Remotely, Dr. Clarene Duke and [place].   CHIEF COMPLAINT:  Chest pain.   HISTORY OF PRESENT ILLNESS:  The patient is a 60 year old black woman  with ongoing tobacco abuse and history of coronary disease, as well as  remote lung cancer who presents to the [facility] ED complaining of  chest pain.  Onset tonight while standing to play bingo this evening,  location all across her anterior chest took her breath away no relief  with sitting, hence came to the ER for evaluation.  No relief with  morphine or nitroglycerin in the ED.  Still with 4/10 substernal chest  pain even when awoken from sleep at my exam.  No associated headache.  No shortness of breath now.  The patient denies lower extremity swelling  or cough.  No previous chest pain or dyspnea on exertion prior to  tonight's onset.  She reports she has not taken medications for over a  year as she has no usual medical issues and reports that she had stopped  smoking for a while, but took this up 1 year ago, resumed upon the death  of her husband had some evaluation.  There is no family at her bedside  and she is sleeping on the ER stretcher within the room.   PAST MEDICAL HISTORY:  1. Significant for coronary disease status post percutaneous      transluminal coronary angioplasty and stent in May 2001, last      coronary cath on record December 2005 was 70% restenosis within the      stent.  2. Hypertension.  3. Dyslipidemia.  4. Mild-to-moderate obstructive sleep apnea by sleep study, August      2006.  5. History  of lung cancer status post left upper lobectomy in 1999.      No evidence of recurrence in 2001.   MEDICATIONS:  The patient takes no regular medications at this time.   ALLERGIES:  ASPIRIN causes itching.   FAMILY HISTORY:  Positive for hypertension, heart disease, but no MI  earlier than age 6.  Positive for stroke.  The patient is unsure of  what family members have.   SOCIAL HISTORY:  She lives alone.  She has been widowed for  approximately 1 year.  She resumed smoking 1 year ago after the death of  her spouse and smokes at this time greater than one pack a day.  She  denies alcohol use or illicit drug use.   REVIEW OF SYSTEMS:  GENERAL:  No weight changes.  No fever or chills.  CARDIOVASCULAR:  See HPI.  Positive for chest pain.  No palpitations.  No lower extremity swelling.  RESPIRATORY:  Positive for shortness of  breath.  No pleurisy, no cough or sputum.  GI:  No nausea, vomiting,  diarrhea, or abdominal pain.  NEURO:  No headache.  No weakness and no  known weight changes.  No hot, cold symptoms and no known history of  diabetes.  See HPI for other details.   PHYSICAL EXAMINATION:  VITAL SIGNS:  Temperature of 97.6, blood pressure  132/80, pulse of 82, respirations 25, initially sating 93% on room air,  now 97% on 1 liter.  GENERAL:  She is a heavy-set somnolent black woman who is sleeping  comfortably on the ER stretcher, but also easily awoken from sleep with  stimulation.  She is in no acute distress, but keeps her eyes closed  during conversation.  EYES:  PERRL.  EOMI.  No icterus or injection when open.  ENT:  Normal hearing grossly without external deformities.  Oropharynx  is clear with moderate-to-poor dentition.  NECK:  Thick and supple.  No JVD, no thyromegaly.  No tenderness.  RESPIRATORY:  No increased work of breathing at rest or use of accessory  muscles with positive diffuse external wheeze bilaterally.  Fair air  movement throughout.  CARDIOVASCULAR:   Regular rate and rhythm.  No murmurs, rubs bilateral  lower extremities with trace edema symmetrically.  ABDOMEN:  Obese, soft, nontender with good bowel sounds.  No masses,  hernias appreciated.  NEURO:  Speech is fluent, follows commands.  No gross deficits.  PSYCH:  She is arousable, oriented x3, disinterested affect, but  appropriate mood, which is irritable when awake.   LABORATORY DATA:  White count 9.3, hemoglobin 14.7, platelets of 311.  Basic metabolic shows random glucose of 138.  Point-of-care cardiac  enzymes negative x2.  D-dimer normal at 0.33,  A 2D chest x-ray shows  mild pulmonary vascular congestion, but no CHF, airspace disease, or  effusion.  EKG is without acute ischemic changes.   ASSESSMENT AND PLAN:  1. Atypical chest pain.  Doubt cardiac etiology be given, known      history of coronary artery disease and continued tobacco abuse with      other risk factors uncontrolled.  She is referred now for further      rule out of MI and acute coronary syndrome.  We will admit to tele.      Serial cardiac enzymes, symptomatic treatment with morphine and      nitroglycerin p.r.n.  If negative cardiac evaluation, question      outpatient followup with Cardiology.  As D-dimer is normal, I doubt      pulmonary embolism and will not further presume, I also doubt need      for further inpatient cardiac evaluation, but we will wait outcome      of further workup and symptom control.  We will add PPI for      possible gastroesophageal reflux disease component and also treat      for pulmonary disease as listed below.  2. Mild acute chronic obstructive pulmonary disease exacerbation.  The      patient has positive expiratory wheeze on exam with mild hypoxia      initially.  Chest x-ray without acute infiltrates that may be      contributing to her symptoms of atypical chest pain.  Will treat      with IV steroids x24 hours and started on empiric Avelox, as well      as nebs daily  and p.r.n.  Please see  orders.  3. Tobacco abuse, discussed with the patient and urge cessation.  4. Known coronary artery disease.  See details #1, above.  5. Hypertension.  Normal BP at this time.  No known regular treatment      prior to admission.  We will monitor and treat as needed.  6. Dyslipidemia history.  Check fasting lipid profile in the a.m.,      again not on treatment prior to admission and question need for      treatment.  7. History of lung cancer status post left lobectomy in 1999, see      above.  8. Mild hyperglycemia.  No history of diabetes.  We will check A1c,      hold sliding scale insulin at this time.  Please see orders for      further details.      Valerie A. Felicity Coyer, MD  Electronically Signed     VAL/MEDQ  D:  12/07/2008  T:  12/07/2008  Job:  [id number]

## 2011-01-20 NOTE — Discharge Summary (Signed)
[name], [name]                ACCOUNT NO.:  [id number]   MEDICAL RECORD NO.:  [id number]          PATIENT TYPE:  INP   LOCATION:  4703                         FACILITY:  [facility]   PHYSICIAN:  Dr. Clarene Duke             DATE OF BIRTH:  03/04/1951   DATE OF ADMISSION:  01/09/2009  DATE OF DISCHARGE:  01/15/2009                               DISCHARGE SUMMARY   DISCHARGE DIAGNOSES:  1. Circumflex myocardial infarction this admission secondary to acute      in-stent thrombosis because of noncompliance with Plavix, treated      with percutaneous coronary intervention.  2. Coronary artery disease, bare metal circumflex stenting in December 11, 2008.  3. Previous multiple right coronary artery interventions in 2001,      2002, 2003, 2004 and 2005.  4. Chronic obstructive pulmonary disease and asthma, the patient was      intubated on admission.  5. Obesity and suspected sleep apnea.  6. Treated hypertension.  7. Depression.  8. Preserved left ventricular function.  9. Escherichia coli urinary tract infection, discharged on      antibiotics.  10.Left pleural effusion on chest x-ray with some residual pleuritic      left chest pain at discharge.   HOSPITAL COURSE:  The patient is a 60 year old female who has had  multiple RCA interventions in the past.  Her last catheterization was in  2005 and showed a 70% RCA.  She was recently admitted in April 2010 with  unstable angina and underwent circumflex mini Vision stenting.  She was  discharged on Plavix.  She presented on Jan 09, 2009 about 3 a.m. to the  emergency room with chest pain and unresponsiveness.  She was intubated  in the emergency room.  Her blood pressure was in the 80s and heart rate  in the 40s.  There was no family present when she was admitted.  She was  admitted to the CCU, started on heparin and nitrates.  It was elected to  hold off on urgent catheterization.  She ultimately was studied on Jan 09, 2009 by Dr. Clarene Duke.   She had a totally occluded circumflex.  The RCA  was a nondominant vessel and the previously placed stent had about a 60-  80% in-stent restenosis.  She underwent a complex intervention with  Multi-Link Vision stents to the circumflex system which ultimately was  successful.  Heart rate and blood pressure were improved.  It was felt  she had acute stent thrombosis secondary to noncompliance with Plavix.  The patient was followed by the Critical Care Service.  She was also  seen by the Neurology Service.  Her vent settings were weaned over the  next couple of days and she was extubated without residual neurologic  deficits.  She did have some abdominal pain and flank pain and was seen  in consult by the GI Service who felt this was musculoskeletal and not  an acute GI etiology.  Please see Dr. Buccini's note.  She was  transferred to  the floor and ambulated.  She has had some recurrent  chest pain in the floor but it is somewhat atypical.  Repeat CK-MB and  troponins showed negative CK-MB with falling troponins.  We suspect she  will be ready for discharge Jan 15, 2009.   LABORATORY DATAS AT DISCHARGE:  CK 45, MB 2.6, troponin 1.18.  White  count 5.6, hemoglobin 12, hematocrit 34.5, platelets 332.  Sodium 139,  potassium 3.9, BUN 7, creatinine 0.7.  Her troponins peaked at 1063 with  217 MBs.  She did develop an E-coli UTI which was treated with Septra.  There was some suspicion she may have a Trichomonas vaginitis but the  Trichomonas prep was negative.  Chest x-ray on Jan 14, 2009 shows  persistent left pleural effusion.  CT of the head on admission shows no  acute process.   DISCHARGE MEDICATIONS:  1. Xopenex 2 puffs q. 4-6 h. p.r.n.  2. Plavix 75 mg a day.  3. Aspirin 325 mg a day.  4. Zocor 40 mg a day.  5. Diovan 80 mg q. 12.  6. Nicotine patch 21 mg a day.  7. Coreg 6.25 mg twice a day.  8. Nitroglycerin 0.4 mg sublingual p.r.n.  9. Protonix 40 mg a day.  10.Lasix 20 mg a  day.  11.Spironolactone 25 mg a day.  12.Septra DS one p.o. b.i.d. for 7 days.   DISPOSITION:  The patient is discharged in stable condition.  She will  follow up with Dr. Clarene Duke as an outpatient.  At some point, she will  need to be considered for sleep study, although compliance has  repeatedly been an issue.      Abelino Derrick, P.A.    ______________________________  Dr. Cyndra Numbers  D:  01/15/2009  T:  01/16/2009  Job:  [id number]

## 2011-01-20 NOTE — Cardiovascular Report (Signed)
[name], HABEL NO.:  [id number]   MEDICAL RECORD NO.:  [id number]          PATIENT TYPE:  INP   LOCATION:  2913                         FACILITY:  [facility]   PHYSICIAN:  Antonieta Iba, MD   DATE OF BIRTH:  1951-02-02   DATE OF PROCEDURE:  12/10/2008  DATE OF DISCHARGE:                            CARDIAC CATHETERIZATION   PERFORMING PHYSICIAN:  Antonieta Iba, MD.   REFERRING PHYSICIAN:  Thereasa Solo. Little, M.D.   REASON FOR PROCEDURE:  Ms. Proffit is a pleasant 60 year old woman with a  history of coronary artery disease, stent placed to her prox/mid RCA in  2003 with PTCA of the RCA in 2001, 2002, and 2004 with last cath in  December 2005 where they saw moderate left circumflex disease at that  time who presents with severe chest pain.  She presents for cardiac cath  and further evaluation of her coronary anatomy.   The risks and benefits of the procedure were discussed with the patient  and consent was obtained.  She is brought to the cardiac catheterization  lab and prepped and draped in the usual sterile fashion.  A modified  Seldinger technique was used to engage the right femoral artery and a 5-  French introducer sheath was inserted.  A 5-French Judkins left #4  catheter and right #4 catheter were used to engage the left main RCA and  RCA ostial respectively.  Hand injection of contrast was used to  visualize the coronary anatomy and cinematography recorded the flow  through the vessels.  A pigtail catheter was used to cross the aortic  valve, into the left ventricle and LV gram was recorded.  The pigtail  catheter was removed to the end of the case and introducer sheath was  kept in place for possible intervention by Dr. Jacinto Halim.   CORONARY ANATOMY:  Left main:  The left main is a moderate-to-large  sized vessel.  It trifurcates into the LAD, ramus/IOM, and left  circumflex.  There is no significant disease noted.  Left anterior descending:  The  LAD is a moderate-to-large sized vessel  that has two moderate-to-large sized diagonal branches.  There is a mild  distal LAD disease around the apex.  Left circumflex:  The left circumflex is a moderate-to-large sized  vessel, likely nondominant with severe mid circumflex disease.  Estimated lesion of 80% to 90%.  Otherwise, there are several obtuse  marginal branches with no significant disease noted.  Ramus:  The ramus is a moderate-sized vessel with no significant disease  noted.  Right coronary artery:  The RCA is a nondominant vessel with stent noted  in the proximal-to-mid region.  There is 40 to 50% in-stent restenosis.  The stent is a 2.25 mm x 8 mm Multi-Link Pixel stent.  There was also  mild-to-moderate mid to distal RCA disease.   LV gram showing no focal wall motion abnormalities.  There is trace  mitral regurgitation with no aortic stenosis.   In summary, nondominant right coronary system, likely left dominant with  moderate in-stent restenosis on the right as well as  mild-to-moderate  diffuse mid-to-distal RCA disease with severe lesion noted in the mid  left circumflex with no significant disease noted in the ramus and mild  disease noted in the distal LAD.   The results were discussed with Ms. Shinn and intervention will be  performed on the left circumflex with her blessing.  She states that she  did not want to come back with any more chest pain.  The stent will  likely be a bare-metal stent given the large size of the vessel and  question of the patient's medication compliance.  The RCA will be  managed medically with no intervention at this time.      Antonieta Iba, MD  Electronically Signed     TJG/MEDQ  D:  12/10/2008  T:  12/11/2008  Job:  [id number]   cc:   Thereasa Solo. Little, M.D.

## 2011-01-20 NOTE — Consult Note (Signed)
NAMEGALENA, LOGIE NO.:  [id number]   MEDICAL RECORD NO.:  [id number]          PATIENT TYPE:  INP   LOCATION:  2315                         FACILITY:  [facility]   PHYSICIAN:  Bernette Redbird, M.D.   DATE OF BIRTH:  July 16, 1951   DATE OF CONSULTATION:  01/11/2009  DATE OF DISCHARGE:                                 CONSULTATION   Dr. Lynnea Ferrier, covering for Dr. Elsie Lincoln, asked [place] to see this 60 year old  female because of abdominal pain.   Ms. Bebee was admitted to the hospital 2 days ago because of occlusion of  a previous stent, associated with hypotension and bradycardia  necessitating intubation.  The vessel was successfully opened up, but  the patient had obtunded mental status yesterday and was seen by  Neurology yesterday for that purpose.  Today, she is more awake and  lucid, but is complaining of abdominal pain in the epigastric and mid  abdominal region.  She does not have much of an appetite.  It seems to  be definitely related to body movements or coughing, but not related to  the small amount of oral intake that she is taking.  I do not believe  there has been any nausea, vomiting, or fevers.  She had a negative  ultrasound several years ago.   PAST MEDICAL HISTORY:  No known allergies.   MEDICATIONS:  Aspirin, Plavix, Lovenox, Lasix, insulin, potassium,  Protonix.   OPERATIONS:  Tubal pregnancy, stent placement, lung cancer resection.   CHRONIC MEDICAL ILLNESSES:  Coronary disease; history of lung cancer,  status post left upper lobectomy; obesity; COPD; sleep apnea;  dyslipidemia.   HABITS:  Still smokes 1 pack per day, nondrinker.   FAMILY HISTORY:  Negative for GI tract illnesses such as ulcers,  gallbladder disease, or liver disease.   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   REVIEW OF SYSTEMS:  Poor appetite, perhaps some mild weight loss over  the past year, although details are  sketchy.  No chronic heartburn or  trouble swallowing.  No problem with chronic abdominal pain to my  knowledge, bowel habits are regular, once or twice a day, occasional  blood on the toilet paper, has never had screening colonoscopy.   PHYSICAL EXAMINATION:  GENERAL:  An obese, pleasant, but slightly  lethargic African American female, in no evident distress, appearing  neither anxious nor depressed.  She is anicteric without evident pallor.  CHEST:  Clear to auscultation.  HEART:  Normal.  ABDOMEN:  Obese.  There is some tenderness to palpation of the muscles,  without significant guarding.  The abdomen is quiet.  There are no  peritoneal findings and I do not appreciate any organomegaly or mass  effect.   LABORATORY DATA:  White count normal at 7300, hemoglobin 11.6, platelets  238,000.  Liver chemistries were normal on admission 2 days ago.  Renal  function is normal.   IMPRESSION:  The history is most compatible with muscular abdominal wall  pain and that it is related to physical body movements  such as coughing  or twisting or moving around in bed, but not related to meals.   RECOMMENDATIONS:  I would treat as per musculoskeletal pain with a  heating pad and, possibly if okay with Cardiology, nonsteroidal anti-  inflammatory medications.  I have recommended that the patient splint  her abdominal wall when she coughs.   I am unclear as to why she may have a strain of the abdominal wall  muscles, although perhaps it was related to movement during the time of  her cardiac catheterization, or conceivably, due to coughing.           ______________________________  Bernette Redbird, M.D.     RB/MEDQ  D:  01/11/2009  T:  01/12/2009  Job:  [id number]   cc:   Madaline Savage, M.D.

## 2011-01-20 NOTE — Cardiovascular Report (Signed)
NAMESTEPHAN, DRAUGHN                ACCOUNT NO.:  [id number]   MEDICAL RECORD NO.:  [id number]          PATIENT TYPE:  INP   LOCATION:  2315                         FACILITY:  [facility]   PHYSICIAN:  Thereasa Solo. Little, M.D. DATE OF BIRTH:  [birth year]   DATE OF PROCEDURE:  01/09/2009  DATE OF DISCHARGE:                            CARDIAC CATHETERIZATION   INDICATIONS FOR TEST:  This 60 year old female has a remote stent in her  right coronary artery and had 2 overlapping stents placed to her  circumflex on December 17, 2008.  She apparently was at home, had chest  pain, took nitroglycerin, then had a syncopal episode, and was  hypotensive, bradycardic.  She therefore required intubation.  Her EKG  shows lateral ST-segment depressions that were new, and she has slightly  elevated troponins at less than 1.0.  Because of the event, she was  brought to the cath lab on the ventilator, sedated.  This was considered  an urgent emergency procedure.  There was no family available to give  consent, and the patient was sedated.   The right groin was prepped and draped in the usual sterile fashion.  Following local anesthetic with 1% Xylocaine, the Seldinger technique  was employed and a 6-French introducer sheath was placed in the right  femoral vein because of the potential need of a pacemaker.  It was quite  difficult to finally cannulate the right femoral artery, and when this  was done, a 5-French sheath was placed.  Following this, left and right  coronary arteriography was performed.   No ventriculogram was performed in an attempt to conserve contrast  exposure.   RESULTS:  1. Central aortic pressure was 98/52.  2. Left main was normal.  LAD crossed the apex, gave rise to 2      diagonal vessels, which were relatively small and this entire LAD      system was free of disease.  3. Optional diagonal:  This had 20% ostial narrowing, otherwise      appeared to be normal.  4. Circumflex:  The  circumflex was a 100% occluded at the left main.      In previous cath, this had been a left dominant system with 2      proximal stents.  It went almost to the ostium.  These stents were      nonDES stents, were placed on December 17, 2008.  5. Right coronary artery:  Nondominant vessel, stented in the proximal      portion with 60-80% in-stent restenosis, but the lumen of the stent      matched the diameter of the distal vessel.  I did not feel this      represented a significant issue.   A complex intervention was then undertaken to the circumflex system.  The 5-French system was upgraded to a 6-French, a JL4 guide catheter and  a short Luge wire was used.  The wire was finally passed through the  occluded stent.  It was very difficult to pass the stent because of  continued prolapse down the LAD.  Once I  had placed the wire in the  distal portion of the circumflex, an Apex 2.0 x 12 balloon was then used  to dilate the occluded area.  Again, it was very difficult to pass  through the proximal portion of the stents.  Once in position, there  were a total of 3 inflations from the ostium to the mid portion of the  circumflex.  Six x41, six x22, and eight x37 seconds.  Despite this, the  stents still had marked irregularities and distal to the previous stent  was an area of 60-70% narrowing.  There was faint visualization of an OM  vessel that had 95% ostial narrowing.  It came off within the occluded  stents.   A 2.75 x 18 Mini Vision stent was then made ready.  Again, it was  difficult to pass the stent around the turn into the proximal stents in  the circumflex; however, once this was finally passed, it was pulled  back so that the ostium of the circumflex and the proximal portion of  the circumflex were covered.  It was deployed at 10 atmospheres for 40  seconds and then 10 atmospheres for 25 seconds.  A 2.5 x 12 Mini Vision  was then passed this time easily through to the new stent and  placed in  a manner that it overlapped the 2.75 stent and that it covered the  entire area of narrowing distal to the previous stents.  It was deployed  at 10 atmospheres for 43 seconds with a final inflation being 11  atmospheres for 34 seconds.   I then postdilated the overlapped area with a 2.75 x 15 mm long balloon  at 12 atmospheres for 41 seconds.   The patient was given intracoronary nitroglycerin.  The native  circumflex, which initially had TIMI-0 flow now had brisk TIMI-3 flow  postintervention.  The entire vessel was widely patent.  OM-1 was now  only minimally visualized, and I did not feel that it was the patient's  best interest to try to go through down to 2 stents to try to open this  ostium.  The optional diagonal, which had been mildly narrowed  previously, was now 80% narrowed.  There was no change in this  appearance with intracoronary nitro but this should improve with time,  and I plan to keep her on Integrilin for another 18 hours.   Immediately with reperfusion of the circumflex, her blood pressure  increased from the 80s to the 120 range and her heart rate increased  from 53 to 68.   I continued to use non-drug-eluting stents because of compliance  problems in the past.  I cannot get a history from her because of her  sedation and being on the ventilator whether she had taken her Plavix  regularly but in the past she had not.  In addition to that, I did not  try the use a single very long stent because I did not think I could get  a 23-mm long stent down into the circumflex.   We will try to wean the nitroglycerin gradually trying to keep her blood  pressure around 100.  Her Integrilin will be continued for 18 hours, and  as she is less sedated, we will try to extubate her.           ______________________________  Thereasa Solo. Little, M.D.     ABL/MEDQ  D:  01/09/2009  T:  01/10/2009  Job:  [id number]   cc:  Ritta Slot, MD  Cath Lab  Noank R. Jacinto Halim,  MD

## 2011-01-20 NOTE — Discharge Summary (Signed)
[name], PARSLOW                ACCOUNT NO.:  [id number]   MEDICAL RECORD NO.:  [id number]           PATIENT TYPE:   LOCATION:                                 FACILITY:   PHYSICIAN:  Beckey Rutter, MD  DATE OF BIRTH:  08-11-1951   DATE OF ADMISSION:  DATE OF DISCHARGE:                               DISCHARGE SUMMARY   PRIMARY CARE PHYSICIAN:  Dr. Mayford Knife.   CHIEF COMPLAINT:  Chest pain.   BRIEF HISTORY OF PRESENT ILLNESS:  A 60 year old [place] American female  with ongoing tobacco abuse admitted to rule out acute coronary syndrome.   HOSPITAL PROCEDURES:  1. Chest x-ray on December 07, 2008.  Impression:  Mild pulmonary vascular congestion.  1. On December 08, 2008, the patient had stable chest x-ray, question mild      pulmonary vascular congestion.  2. On December 08, 2008, the patient had chest x-ray status post      peripherally inserted central catheter line placement.  Impression:  A.  Right peripherally inserted central catheter line placement without  complication feature.  B.  Postoperative change and volume loss in the left hemithorax.  1. On December 09, 2008, the patient had chest x-ray.  Impression:  No active cardiopulmonary disease.   As of today, her sodium is 140, potassium 4.0, chloride 107, bicarb is  30, glucose 182, BUN is 13, creatinine 0.75.  CBC showing white blood  count 21.3.  The patient is on IV steroid.  Her hemoglobin is 13.5,  hematocrit is 37.9, and platelet count is 355.  Her troponin today is  1.03, trending down.   The patient underwent cardiac catheterization with 2 stents placement.  The result of the cardiac cath was showing severe LCX disease.  Successful complex coronary and capacity of the mid circumflex coronary  artery with implantation of a 2.5 x 15 mm Mini Vision followed by  overlapping 2.5 x 8 mm Mini Vision into the proximal and ostial  circumflex coronary artery with good overlap.  A 90% stenosis reduced to  0% with a brisk TIMI-3  flow at the end of procedure.  No side branch  compromise.  The recommendation is that the patient will be discharged  home in 24-48 hours if she remains stable.  Nondrug-eluting stents were  utilized.  This was done as long-term Plavix use was questionable in  this particular patient.   HOSPITAL CONSULTATION:  [facility].  The cardiac cath  and stenting was done by Dr. Yates Decamp.   PROBLEMS:  1. Chest pain:  The patient was diagnosed with non-ST-elevation      myocardial infarction with elevated cardiac markers.  The patient      is currently stable from the cardiology/myocardial infarction point      of view.  2. The patient is a smoker with symptoms of chronic obstructive      pulmonary disease with exacerbation clinically.  She was started on      steroid and Avelox with improvement of her clinical dyspnea.  She      will be discharged with  tapering dose of steroid and Xopenex.  3. Smoking cessation:  The patient was counseled several times and      smoking cessation counseling was conducted while she is in the      hospital by [facility]ess[facility].  The patient will      be discharged on nicotine patch.  4. Medical noncompliance:  The patient was advised to follow up      closely with her primary physician, and she would return to the      [facility] within 2-3 weeks as discussed with her.   DISCHARGE DIAGNOSES:  1. Non-ST elevation myocardial infarction.  2. Chronic obstructive pulmonary disease with acute exacerbation.  3. Ongoing tobacco abuse.  4. Hypertension.  5. Medical noncompliance.   DISCHARGE MEDICATIONS:  1. Prednisone tapering dose.  2. Xopenex MDI 2 puffs inhaled q.4-6 h. p.r.n.  3. Plavix 75 mg p.o. daily.  4. Aspirin 325 mg p.o. daily.  5. Metoprolol 12.5 mg p.o. b.i.d.  6. Zocor 40 mg p.o. nightly.  7. Diovan 80 mg p.o. q.12 h.  8. Nicotine 21 mg patch to be changed daily.   During the hospital stay, the  patient wanted to sign against medical  advice before her cath and she would not reason in regards of signing  against medical advise although at that time she was diagnosed with non-  ST-elevation MI and in preparation for coronary angiogram.  The patient  had a psychiatric consultation with recommendation of Haldol p.r.n.  The  patient then agreed to stay for the treatment of her non-ST-elevation  MI, and she seems to have unpredictable behavior, which is felt  secondary to depression, although there is no clinical diagnosis by the  psychiatrist.  Again, I advised her to follow up closely with her  primary physician, and all her questions were encouraged and answered.      Beckey Rutter, MD  Electronically Signed     EME/MEDQ  D:  12/11/2008  T:  12/11/2008  Job:  [id number]   cc:   Dr. Mayford Knife

## 2011-01-20 NOTE — Consult Note (Signed)
NAMEKELLEY, POLINSKY NO.:  [id number]   MEDICAL RECORD NO.:  [id number]          PATIENT TYPE:  INP   LOCATION:  2315                         FACILITY:  [facility]   PHYSICIAN:  Marlan Palau, M.D.  DATE OF BIRTH:  08/25/1951   DATE OF CONSULTATION:  01/10/2009  DATE OF DISCHARGE:                                 CONSULTATION   HISTORY OF PRESENT ILLNESS:  [name] is a 60 year old black female,  born on [birth year], with a history of coronary artery disease.  This  patient was admitted in April 2010 with subendocardial myocardial  infarction.  This patient underwent stenting procedure at that time but  comes back in with an occlusion of a prior stent.  The patient had  bradycardia and hypotension, required intubation.  The patient was  restented on the day of admission.  The patient has been extubated at  this point but today was noted to have significant problems with  lethargy, cannot be fully aroused, had some questionable left-sided  facial droop.  Code stroke was called, and Neurology is seeing this  patient for this reason.  A CT scan of the brain by my review appears to  be unremarkable.  At this point, the patient is following commands,  moving all 4 extremities, but seemed sleepy, encephalopathic.   PAST MEDICAL HISTORY:  Significant for:  1. New-onset altered mental status, CT negative.  2. Obesity.  3. Coronary artery disease, status post stent and has had a redo on      this admission.  4. MI in April 2010.  5. COPD.  6. Lung cancer status post left upper lobe lobectomy.  7. Sleep apnea.  8. Dyslipidemia.  9. __________ to aspirin.   Smokes a pack of cigarettes a day.  Does not drink alcohol.   CURRENT MEDICATIONS:  1. Aspirin 325 mg daily.  2. Peridex 15 mL q.8 h.  3. Plavix 75 mg daily.  4. Lovenox 40 mg subcu daily.  5. Lasix 20 mg IV if needed.  6. Sliding scale insulin.  7. Potassium 40 mEq daily.  8. Protonix 40 mg IV daily.  9.  Fentanyl if needed.  10.Versed if needed.   SOCIAL HISTORY:  This patient is widowed, lives in the [place], [place], area.  The patient is not employed.  The patient lives alone.   FAMILY MEDICAL HISTORY:  Notable for hypertension, heart disease.  Stroke is in the family as well.   REVIEW OF SYSTEMS:  Difficult to obtain due to the patient's lethargy.  The patient denies headache, does note abdominal pain, does complain of  some slight shortness of breath, has some nausea.   PHYSICAL EXAMINATION:  VITAL SIGNS:  Blood pressure is 134/62, heart  rate 67, respiratory rate 23.  GENERAL:  This patient is a moderately obese black female, who is sleepy  but can be aroused at the time of examination.  HEENT:  Head is atraumatic.  Eyes, pupils are 2-3 mm, trace reactive,  disks are flat.  NECK:  Supple.  No carotid bruits noted.  RESPIRATORY:  Clear.  CARDIOVASCULAR:  Regular rate and rhythm.  No obvious murmurs or rubs  noted.  EXTREMITIES:  A 1+ edema.  NEUROLOGIC:  Again, the patient is sleepy, can be aroused.  The patient  will respond when stimulated but goes to sleep when left alone.  Extraocular movements are full.  Visual fields are full.  Speech is  hoarse, not aphasic.  The patient will answer verbal questions.  The  patient has good strength on all fours, some drift is noted on all  fours, some asterixis is noted with upper extremities.  Deep tendon  reflexes are depressed but symmetric.  Toes neutral bilaterally.  The  patient has fair finger-nose-finger and heel-to-shin bilaterally.  Gait  was not tested.  The patient reports symmetric pinprick and vibratory  sensation throughout.   LABORATORY VALUES:  Notable for a blood gas with pH of 7.385, PCO2 of  36.1, PO2 of 110.  The patient has a white count of 8.2, hemoglobin  11.7, hematocrit 33.5, MCV of 95.2, platelets of 272.  Sodium 135,  potassium 3.7, chloride of 108, CO2 of 20, glucose 116, BUN of 5,  creatinine  of 0.53, calcium of 8, phos of 3.3, magnesium 2.0.   IMPRESSION:  1. Altered mental status with toxic metabolic encephalopathy.  2. Coronary artery disease status post stent.  3. Tobacco abuse.  4. Hypertension.  5. Dyslipidemia.   This patient has had alteration in mental status for some reason today.  The patient has no evidence of focality, no evidence of a stroke event.  CT scan by my review appears to be unremarkable.  No acute changes were  seen.  Blood gas done shows no evidence of CO2 narcosis.  The patient  may have sedation from pain medications.  We will follow the patient  clinically.  If altered mental status continues, we will check an EEG  study and a repeat CT scan of the brain.  The patient will be followed.  No indication for stroke management.      Marlan Palau, M.D.  Electronically Signed     CKW/MEDQ  D:  01/10/2009  T:  01/11/2009  Job:  [id number]   cc:   [facility]

## 2011-01-23 NOTE — Cardiovascular Report (Signed)
[name], [name]                ACCOUNT NO.:  [id number]   MEDICAL RECORD NO.:  [id number]          PATIENT TYPE:  INP   LOCATION:  2034                         FACILITY:  [facility]   PHYSICIAN:  Darlin Priestly, MD  DATE OF BIRTH:  1951/02/17   DATE OF PROCEDURE:  08/12/2004  DATE OF DISCHARGE:                              CARDIAC CATHETERIZATION   PROCEDURES:  1.  Left heart catheterization.  2.  Coronary angiography.  3.  Left ventriculogram.   ATTENDING PHYSICIAN:  Darlin Priestly, M.D.   COMPLICATIONS:  None.   INDICATIONS:  [name] is a 60 year old female patient of Dr. Lenise Herald  with history of coronary artery disease status post remote percutaneous  transluminal coronary angioplasty and stenting of the proximal mid RCA using  a 2.25 Pixel stent by Dr. Daphene Jaeger on Feb 04, 2000.  The patient has had  four subsequent caths, all revealing in-stent restenosis of the RCA.  She  also has a history of hypertension, hyperlipidemia, history of noncompliance  and ongoing tobacco use and has not seen [place] in greater than 3 years.  She  was admitted on August 11, 2004 with recurrent chest pain.  She  subsequently ruled out for myocardial infarction.  Because of her known  coronary artery disease and noncompliance, she is now brought for repeat  cardiac catheterization to reassess her coronary artery disease.   DESCRIPTION OF OPERATION:  After obtaining informed written consent, the  patient was brought to the cardiac catheterization laboratory.  Right and  left groin were  shaved and prepped and draped in the usual sterile fashion.  ECG monitor was established.  Using the modified Seldinger technique, a 6  French arterial sheath was inserted in the right femoral artery. A 6 French  diagnostic catheter was then used to performed diagnostic angiography.   Left main is a large vessel with no significant disease.  LAD is a large  vessel which courses around the apex and gives  rise to two diagonal  branches.  The LAD has mild 30% mid vessel narrowing after take off of the  second diagonal with no high grade stenosis.  The first diagonal is a large  vessel with no significant disease.  The second diagonal is a medium size  vessel with 50% ostial lesion.   The left coronary artery gives rise to a medium size ramus intermedius with  no significant disease.   Left circumflex was a large vessel which was dominant and gives rise to one  obtuse marginal as well as PDA.  AV groove circumflex is noted to have 60%  stenosis just prior to the take off of the first OM.  There is no further  high grade stenosis in the AV groove circumflex.   The first OM is a medium size vessel with no significant disease.   The PDA and posterior lateral branch originate from the left circumflex and  appear to be widely patent.   The right coronary artery is a small nondominant vessel with two overlapping  stents noted in its proximal and early mid segment.  There is diffuse 70-80%  in-stent restenosis within these previously placed stents with pressure  damping when engaged.  The more distal portion of the RCA is noted to have a  diffuse 70% disease.   Left ventriculogram reveals a preserved EF of 60%.   HEMODYNAMICS:  Systemic arterial pressure 145/85, LV pressure 144/16, LVEDP  of 24.   CONCLUSIONS:  1.  Significant one-vessel coronary artery disease involving nondominant      right coronary artery.  2.  Normal left ventricular systolic function.  3.  Systemic hypertension.  4.  Elevated left ventricular end-diastolic pressure.      Robe   RHM/MEDQ  D:  08/12/2004  T:  08/12/2004  Job:  [id number]

## 2011-01-23 NOTE — H&P (Signed)
[place]  Patient:    [name], [name] Visit Number: [id number] MRN: [id number]          Service Type: MED Location: [phone] Attending Physician:  Darlin Priestly Dictated by:   Julieanne Manson, M.D. Admit Date:  10/29/2001   CC:         [name], M.D.  Lenise Herald, M.D.   History and Physical  CHIEF COMPLAINT: [name] is a 60 year old female, who is being admitted with unstable angina.  HISTORY OF PRESENT ILLNESS: She has a past history of several interventions to her right coronary artery, initially May 2001 with stent placed to the RCA. She had in-stent restenosis in August 2001 which was treated with a cutting balloon.  She reappeared May 2002 with recurrent angina, again had in-stent restenosis, and was treated with cutting balloon.  Because she does not like to take medicines, on her own she stopped her medicines about six months ago.  About three months ago she began having recurrent episodes of angina and over the last two to three days has had more chest pain, with diaphoresis and shortness of breath.  Does not feel like she can go any distance before she is breathless.  She was given two sublingual nitroglycerin by EMS on the way in and her pain improved.  She has not seen Dr. Jenne Campus in over six months and actually did not come by for regular follow-up, and she has not seen Dr. Allyne Gee for over nine months.  She has a nebulizer at home that she is supposed to use for asthma but she does not use it despite wheezing.  She, of course, continues to smoke.  PAST MEDICAL HISTORY:  1. Hypertension.  2. Coronary disease.  3. Asthma.  4. Lung cancer with resection of left upper lobe about three years ago.  5. Hyperlipidemia.  SOCIAL HISTORY: The patient is married.  She still smokes.  She lives with her husband.  She is on disability secondary to depression.  FAMILY HISTORY: Positive for hypertension, CAD,  diabetes, and CVA.  REVIEW OF SYSTEMS: Chronic wheezing and dyspnea on exertion.  No fever or chills.  No syncope.  Generalized weakness over the last six to eight months. Bowel habits normal.  No blood in bowel movement.  No diet, no exercise.  PHYSICAL EXAMINATION:  VITAL SIGNS: Blood pressure 143/76, this on IV nitroglycerin.  Heart rate is 80 and in sinus rhythm.  She is afebrile.  GENERAL: She was asleep in bed.  I had to wake her up to get a history from her, and she immediately complains of pain and tries to go back to sleep.  SKIN: Warm and dry.  NECK: Supple.  I cannot palpate the thyroid.  She has no carotid bruits and no JVD.  LUNGS: Expiratory wheezes bilaterally.  CARDIAC: Regular rhythm.  I did not appreciate a murmur.  CHEST: She has a left lower back/chest scar from her lobectomy.  ABDOMEN: Slightly distended, nontender, mildly obese.  I cannot palpate the liver edge.  She has no edema.  Pulses 2+ bilaterally.  NEUROLOGIC: Mental status, the patient seems to be very slow in response to questions but has no obvious focal findings.  CK 54 with MB 0.3.  Troponin is 0.01.  Hemoglobin is normal at 14.2. Potassium slightly depressed at 3.2.  EKG completely normal.  Chest x-ray, no active disease.  ASSESSMENT:  1. Unstable angina secondary to noncompliance with medications.  2. Asthma,  with continued cigarette abuse.  3. History of lung cancer.  4. History of hypertension.  5. History of depression.  PLAN: At this point, the patient is on IV nitroglycerin and IV heparin will be added to her medication.  She states she gets hives from aspirin so she will not be treated with aspirin.  She will need cardiac catheterization on Monday, and I will give her a nebulizer for her asthma.  Despite doing everything we can do to try to improve her medical situation, she has already stated she will not take her medications when she is discharged from the hospital. Dictated  by:   Julieanne Manson, M.D. Attending Physician:  Darlin Priestly DD:  10/29/01 TD:  10/30/01 Job: [id number]

## 2011-01-23 NOTE — Procedures (Signed)
NAME:  [name], [name]                ACCOUNT NO.:  [id number]   MEDICAL RECORD NO.:  [id number]          PATIENT TYPE:  OUT   LOCATION:  SLEEP CENTER                 FACILITY:  [facility]   PHYSICIAN:  Clinton D. Maple Hudson, M.D. DATE OF BIRTH:  1951/03/19   DATE OF STUDY:  04/14/2005                              NOCTURNAL POLYSOMNOGRAM   REFERRING PHYSICIAN:  Dr. Lenise Herald   DATE OF STUDY:  April 14, 2005   INDICATION FOR STUDY:  Hypersomnia with sleep apnea. Epworth Sleepiness  Score 10/24, BMI 35, weight 211 pounds.   SLEEP ARCHITECTURE:  Total sleep time 292 minutes with sleep efficiency 72%.  Stage I was 9%, stage II 70%, stages III and IV were absent, REM was 22% of  total sleep time. Sleep latency 75 minutes, REM latency 1 minute,  awake  after sleep onset 36 minutes, arousal index 18. No bedtime medication taken.   RESPIRATORY DATA:  Respiratory disturbance index (RDI, AHI) 15.2 obstructive  events per hour indicating mild to moderate obstructive sleep apnea/hypopnea  syndrome. There were 29 obstructive apneas and 45 hypopneas. Events were not  positional. REM RDI 63. She did not have enough early events to permit use  of CPAP titration by split study protocol on the study.   OXYGEN DATA:  Moderate snoring with oxygen desaturation to a nadir of 86%.  Mean oxygen saturation through the study was 97% on room air.   CARDIAC DATA:  Sinus rhythm with occasional PVC.   MOVEMENT/PARASOMNIA:  Total of 42 limb jerks were recorded of which 9 were  associated with arousal or awakening for a periodic limb movement with  arousal index of 1.8 per hour which is probably insignificant.   IMPRESSION/RECOMMENDATION:  1.  Mild to moderate obstructive sleep apnea/hypopnea syndrome, Respiratory      Disturbance Index 50.2 per hour, with moderate snoring and oxygen      desaturation to 86%.  2.  Consider return for continuous positive airway pressure titration or      evaluate for alternative  therapies as appropriate.  3.  Very mild periodic limb movement, 1.8 arousals per hour. Bathroom x3      which may reflect cardiac/medication impact on sleep.      Clinton D. Maple Hudson, M.D.  Diplomat    CDY/MEDQ  D:  04/19/2005 10:34:20  T:  04/19/2005 20:08:31  Job:  [id number]

## 2011-01-23 NOTE — Discharge Summary (Signed)
[place]  Patient:    [name], [name]                         MRN: [id number] Adm. Date:  [id number] Disc. Date: [id number] Attending:  Darlin Priestly Dictator:   Halford Decamp. Delanna Ahmadi, R.N., N.P. CC:         Dr. Lelon Perla, Triage Family Medicine                           Discharge Summary  HISTORY OF PRESENT ILLNESS:  Mrs. [name] is a 60 year old black married female patient referred to [place] by Dr. Lelon Perla secondary to chest pain.  She apparently most recently has had an asthma exacerbation was treated with steroids and inhalers.  She went back to see Dr. Lelon Perla for follow up. She had chest pain while in the office. She then stated that she had been having chest pain on and off for the last few months, thus she was referred to [facility] Emergency [facility].  Her EKG in the emergency room showed normal sinus rhythm without any acute changes.  She stated that the chest pain felt like a pressure it was a pushing and a pulling. It started at her left breast area radiated across her chest. It was associated with weakness and presyncope. No shortness of breath. Some diaphoresis and tachy palpitations.  It is not related to exertion or rest. It lasts for a few minutes at a time. It occurred more frequently over the last few months. Cardiac risk factors including hypertension, tobacco, she did not know her cholesterol level and a positive premature family history of cardiovascular disease.  Her labs showed that her CK MBs and troponin were negative. Her hemoglobin, hematocrit, BUN and creatinine were all within normal limits.  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  Her lymph node dissection was negative.  In Dr. Lelon Perla office she was also very hypertensive.  This is a new diagnosis. Her blood pressure in our ER was 176/102, 188/98.  She was put on IV nitroglycerin and Plavix.  She was started on O2 and IV heparin. She  is allergic to aspirin. This was not given. Her blood pressure came down with the IV nitroglycerin.  She was seen by Dr. Susa Griffins. It was decided that she needed to be admitted and ruled out for an MI and to have a cardiac catheterization the following day.  HOSPITAL COURSE:  On Feb 04, 2000 she had a cardiac catheterization by Dr. Tresa Endo. She was found to have an 80% lesion in the proximal RCA.  Did not change with IV nitroglycerin. She underwent PTCA and stenting. She also had a 40% lesion in her LAD that reduced down to 20% with IV nitroglycerin.  On Feb 05, 2000 she was considered stable to be discharged home. Her groin was stable without a hematoma or bruising.  LABORATORY DATA:  On Feb 03, 2000 showed a sodium of 138, potassium 3.5, BUN 11, creatinine 0.5.  Her hemoglobin was 15.6, hematocrit 43.7, WBC 5.9, platelets 404.  She did have a fasting lipid profile drawn. Her total cholesterol was 139, triglycerides 68, HDL 51 and LDL was 74.  DISCHARGE MEDICATIONS: Plavix 75 mg once per day. Imdur 30 mg once per day. Altace 2.5 mg once per day. Protonix 40 mg once per day for two weeks. Wellbutrin SR 150 mg one a  day for three days and then she will take one twice a day.  She is committed to quitting smoking. Nitroglycerin 1/150 p.r.n. for chest pain.  She is to do strenuous activity.  No driving for four days.  She will return to work on Tuesday.  If she has any problems with her groin she will give our office a call. She will follow up with Lezlie Octave, N.P. on February 20, 2000 at 11 a.m.  DISCHARGE DIAGNOSES: 1. Unstable angina. 2. Coronary artery disease status post cardiac catheterization with an 80%    proximal lesion in her right coronary artery reduced to 0% with    percutaneous transluminal coronary angioplasty and stenting. Residual    disease at 40% left anterior descending reduced with intravenous    nitroglycerin to 20%. 3. New onset of hypertension. She was  ordered Norvasc by Dr. Lelon Perla;    however, we have held this at this time.  She is now on Altace and Imdur    with a blood pressure of 130/70. 4. Premature cardiovascular family history. 5. Tobacco use.  We will try Wellbutrin and she will try quitting smoking. 6. Asthma. DD:  02/05/00 TD:  02/05/00 Job: [id number]

## 2011-01-23 NOTE — Discharge Summary (Signed)
[place]  Patient:    [name], [name]                       MRN: [id number] Adm. Date:  [id number] Disc. Date: [id number] Attending:  Ruta Hinds Dictator:   Marya Fossa, P.A. CC:         Lenise Herald, M.D.  Dr. Mina Marble III, M.D.   Discharge Summary  DATE OF BIRTH:  1950-09-16  ADMISSION DIAGNOSES: 1. Chest pain, rule out myocardial infarction. 2. Coronary artery disease. 3. Hypertension. 4. Hyperlipidemia. 5. Adenocarcinoma of the lung, status post left upper lobectomy. 6. History of medicine noncompliance.  DISCHARGE DIAGNOSES: 1. Chest pain, myocardial infarction ruled out with negative enzymes, status    post cardiac catheterization, Jan 19, 2001, revealing 99% proximal right    coronary artery lesion, status post intervention by Dr. Jenne Campus. 2. Coronary artery disease. 3. Hypertension. 4. Hyperlipidemia. 5. Adenocarcinoma of the lung, status post left upper lobectomy. 6. History of medicine noncompliance.   HISTORY OF PRESENT ILLNESS:  [name] is a 60 year old black female patient of Dr. Jenne Campus with known coronary artery disease (RCA), hypertension, hyperlipidemia, and history of lung cancer.  Approximately one to two weeks ago, the patient started developing occasional episodes of sharp chest pain across her chest.  She required nitroglycerin on a few occasions.  Last evening, she had tightening in the center of her chest with discomfort in the left neck, left arm, shortness of breath, and nausea.  This lasted several minutes.  She does not use nitroglycerin, but decided to come to the emergency room.  Her symptoms continued to wax and wane throughout most of the night. She currently has tightness in the left breast area, but no shortness of breath.  EKG in the emergency room showed normal sinus rhythm without acute ST or T wave abnormalities.  Initial enzymes were negative.  Her  potassium is slightly low at 3.3.  The patient will be admitted for chest pain, rule out MI.  She does have a pleuritic component to her pain on examination, may have some asthmatic bronchitis.  Additionally, she has hemoccult positive stool on examination. This will need to be followed up as she does have a history of cancer.  Her hemoglobin is stable at 13.9, but we will need to be careful with anticoagulation.  We have counseled her on smoking cessation and recommended Wellbutrin.  Will replace her potassium.  PROCEDURE:  Cardiac catheterization, Jan 19, 2001, by Lenise Herald, M.D. with intervention to the RCA.  CONSULTING PHYSICIANS:  Gastroenterology, Dr. Laural Benes, for GI hemoccult positive stool.  Medicine consult, Dr. Allyne Gee for management of medical issues.  COMPLICATIONS:  None.  HOSPITAL COURSE:  [name] was admitted to [facility] on Jan 17, 2001, for evaluation of chest pain to rule out MI.  EKG was nonacute in the emergency room.  Cardiac enzymes were negative x 4.  The patient was started on a nitroglycerin drip, heparin per pharmacy, aspirin, and Plavix.  Examination revealed hemoccult positive stool upon admission with a stable hemoglobin of 13.9 and platelet count 419.  She does have a history of lung cancer.  GI consult was called on Jan 18, 2001.  Dr. Laural Benes saw the patient in consultation.  He recommended monitoring her CBC while in the hospital.  He would reserve EGD and colonoscopy for significant bleeding.  If she remained stable, he would recommend  outpatient workup of colonoscopy and EGD for hemoccult positive stool.  Dr. Allyne Gee was also consulted on Jan 18, 2001, to help manage her from a medical standpoint.  She agreed with albuterol nebulizer therapy, but did not think IV steroids was nessary at this time.  She states she will persue pulmonary evaluation on an outpatient basis.  The patient continued to have chest tightness  during her hospital stay despite being on IV nitroglycerin and heparin.  She remained hemodynamically stable and as mentioned before, cardiac enzymes remained negative.  The patient was taken to the cardiac catheterization lab on Jan 19, 2001. This revealed normal left coronary and left circumflex system.  The RCA had a 99% proximal lesion.  Dr. Jenne Campus proceeded with cutting balloon intervention using a 2.5 x 15 balloon and PTCA reducing the 99% lesion to less than 10%. Integrilin was bolused and infused.  Plavix was started.  The patient tolerated the procedure well.  There were no problems with sheath pull.  Of note, the patient had a low potassium of 3.3 on admission.  This was repleted.  At discharge, her potassium was 4.0.  The patient was felt stable for discharge to home on Jan 20, 2001.  DISCHARGE MEDICATIONS:  1. Plavix 75 mg a day for a month.  2. Advair 100/50 take one inhalation twice a day.  3. Protonix 40 mg a day.  4. Lipitor 20 mg at night.  5. Toprol XL 25 mg a day.  6. Nebulizers as at home.  7. Altace 5 mg a day.  8. Wellbutrin 150 twice a day.  9. Aspirin 81 mg a day. 10. Nitroglycerin as needed for chest pain.  ACTIVITY:  No strenuous activity, lifting more than five pounds, or driving for two days.  DIET:  Low fat, low cholesterol, low salt diet.  She was asked to stop smoking.  FOLLOW-UP:  The patient may shower.  Follow-up appointment has been scheduled with Dr. Jenne Campus for May 30, at 4:25.  She needs to follow up with Dr. Allyne Gee or Dr. Renae Gloss.  She needs to call for an appointment.  She also needs further workup of hemoccult positive stool. DD:  02/11/01 TD:  02/12/01 Job: [id number]

## 2011-01-23 NOTE — Discharge Summary (Signed)
NAMEJANI, MORONTA NO.:  [id number]   MEDICAL RECORD NO.:  [id number]          PATIENT TYPE:  INP   LOCATION:  2034                         FACILITY:  [facility]   PHYSICIAN:  Darlin Priestly, MD  DATE OF BIRTH:  Sep 25, 1950   DATE OF ADMISSION:  08/11/2004  DATE OF DISCHARGE:  08/13/2004                                 DISCHARGE SUMMARY   Ms. [name] is a 60 year old African-American female patient with  history of coronary artery disease.  She came to the emergency room because  of chest pain.  She apparently had original stenting to her RCA in May 2001.  She has had four subsequent catheterizations since then, all of which showed  in-stent restenosis and she had cutting balloon PCI.  Her last  catheterization was March 2004.  She also has hypertension, lipids, smoking,  and noncompliance.  She apparently was not taking her medications and it was  decided that she should be admitted to rule out MI and she should have IV  heparin.  She underwent cardiac catheterization on August 12, 2004 by Dr.  Lenise Herald.  She had a long 70% lesion in her RCA of which she had 70%  in-stent restenosis.  She had a 60% circumflex lesion which was the dominant  vessel and she had a minimal 30% in her LAD and a 50% diagonal 2 lesion.  Her EF was 60%.  Dr. Jenne Campus decided she should undergo outpatient  Cardiolite to assess for left circumflex ischemia and she should have  __________ of her recurrent in-stent restenosis of a nondominant RCA.  On  August 13, 2004 she was seen by Dr. Lenise Herald, considered stable for  discharge home.  Her right groin was okay.  She had no hematoma.  Her blood  pressure was 146/82.  Her pulse was 65.  Respirations were 20.  Temperature  was 97.1.   LABORATORIES:  Hemoglobin 13.2, hematocrit 37.8, WBC 6, platelets 359.  Sodium 140, potassium 3.5, BUN 6, creatinine 0.7.  TSH 0.625.  Total  cholesterol 138, triglycerides 60, HDL 44, LDL 82,  VLDL 12.  CK-MB was  negative x3.  Troponins negative x3.  AST was 15.  ALT was 18.  Chest x-ray  showed no active disease with clips in the left hilum.   DISCHARGE MEDICATIONS:  1.  Plavix 75 mg one time per day.  2.  Zocor 10 mg at bedtime.  3.  Imdur 60 mg one time per day.  4.  Toprol XL 25 mg one time per day.  5.  Altace 5 mg b.i.d.   She should do no strenuous activity, no lifting, pushing, pulling, or  extended walking x4 days.  No driving x1 day.  She should be on a low fat  diet.  If she has any problems with her __________ she will give our office  a call.  She should stop smoking.  She will have a follow-up Persantine  Cardiolite December 15 at 10:30.  She will follow up with Dr. Jenne Campus on  December 22 at 2:15.   DISCHARGE DIAGNOSES:  1.  Angina.  2.  Coronary artery disease status post catheterization with progressive      disease, recurrent in-stent stenosis of her nondominant right coronary      artery with a 70% long lesion.  New 60% lesion in her circumflex,      minimal disease in her left anterior descending 30%, and diagonal 2 50%.  3.  Ejection fraction of 60%.  4.  Hypertension.  5.  Hyperlipidemia.  6.  Tobacco smoking.  7.  Noncompliance with medications.      Beve   BB/MEDQ  D:  08/13/2004  T:  08/13/2004  Job:  [id number]   cc:   [facility]

## 2011-01-23 NOTE — Discharge Summary (Signed)
[place]  Patient:    [name], [name] Visit Number: [id number] MRN: [id number]          Service Type: MED Location: [id number] Attending Physician:  Darlin Priestly Dictated by:   Abelino Derrick, P.A.C. Admit Date:  10/29/2001 Discharge Date: 11/01/2001   CC:         Velna Hatchet, M.D.   Discharge Summary  DISCHARGE DIAGNOSES: 1. Coronary disease, right coronary artery stenting this admission. 2. History of hypertension. 3. History of smoking. 4. History of lung carcinoma. 5. Asthmatic chronic obstructive pulmonary disease. 6. History of prior right coronary artery stenting May 2001, with in-stent    restenosis August 2001 and May 2002.  HOSPITAL COURSE:  [name] is a 60 year old female followed by Dr. Allyne Gee and Dr. Jenne Campus with coronary disease as noted above.  She, unfortunately, does not take her medications regularly.  She was admitted October 29, 2001, with chest pain consistent with unstable angina.  She apparently had stopped her medicines about six months ago.  She continues to smoke.  HOSPITAL COURSE:  She was admitted through the emergency room.  Initial CK, MB, and troponins were negative.  She was started on IV heparin, aspirin, nitrates.  The troponins and CKs continued to be negative.  She was set up for diagnostic catheterization, which was done October 31, 2001.  This revealed a  90% RCA that was dilated and stented with cutting balloon by Dr. Tresa Endo. Circumflex was without significant stenosis, she had a ramus intermedius that was without significant stenosis.  The LAD had a 40-50% mid-lesion that went to 20-30 with intracoronary nitroglycerin.  Overall LV function was normal. The patient tolerated the procedure well and was stable postoperatively.  We felt that she could be discharged November 01, 2001.  DISCHARGE MEDICATIONS: 1. Norvasc 5 mg a day. 2. Coated aspirin q.d. 3. Altace 5 mg a day. 4.  Lipitor 10 mg a day. 5. Plavix 75 mg a day for four weeks. 6. Nitroglycerin sublingual p.r.n.  LABORATORY DATA:  EKG shows sinus rhythm without acute changes.  Chest x-ray shows no active disease.  Hematology shows white count 8.1, hemoglobin 14.6, hematocrit 42.1, platelet count 385.  INR 1.2.  Sodium 135, potassium 3.5, BUN 6, creatinine 0.7.  Lipid profile shows cholesterol of 117, HDL 43, LDL 67. TSH 0.73.  Urinalysis unremarkable.  DISPOSITION:  The patient is discharged in stable condition and will follow up with Dr. Jenne Campus March 11 at 2:45. Dictated by:   Abelino Derrick, P.A.C. Attending Physician:  Darlin Priestly DD:  11/22/01 TD:  11/23/01 Job: [id number]

## 2011-01-23 NOTE — Discharge Summary (Signed)
[name], [name]                ACCOUNT NO.:  [id number]   MEDICAL RECORD NO.:  [id number]          PATIENT TYPE:  INP   LOCATION:  2027                         FACILITY:  [facility]   PHYSICIAN:  Darlin Priestly, MD  DATE OF BIRTH:  02-May-1951   DATE OF ADMISSION:  04/20/2005  DATE OF DISCHARGE:  04/21/2005                                 DISCHARGE SUMMARY   DISCHARGE DIAGNOSES:  1.  Chest pain, myocardial infarction ruled out.  2.  Known coronary disease with prior right coronary artery stenting Feb 04, 2000 with last catheterization December 2005 showing 70-80% in-stent      restenosis.  3.  Hypertension.  4.  Hyperlipidemia.  5.  History smoking.  6.  Aspirin allergy.   HOSPITAL COURSE:  The patient is a 60 year old female followed by Dr. Clarene Duke  with history of coronary disease as noted above. She had PTCA and stent to  the RCA Feb 04, 2000 with five catheters since. Her last catheterization was  August 12, 2004; the results as noted above. She presented with chest  throbbing and tightness, some diaphoresis, shortness of breath and diarrhea  and nausea. She took nitroglycerin with no relief and came to the emergency  room. EKG in the emergency room was normal. She was seen by Dr. Clarene Duke.  Enzymes were negative. She was started on IV heparin. The next day, Dr.  Clarene Duke felt her pain was more typical for GI and we got a gallbladder  ultrasound. This was unremarkable. She is discharged later on the 15th and  will be seen in the office as an outpatient for a Cardiolite study.   DISCHARGE MEDICATIONS:  1.  Lotrel 10/20 daily.  2.  Toprol XL 25 milligrams a day.  3.  Protonix 40 milligrams once a day for 1 month.  4.  Prilosec OTC.   LABS:  EKG shows sinus rhythm without acute changes. Chest x-ray shows  stable postoperative changes. No acute findings. Gallbladder ultrasound  shows a diffuse fatty infiltration of the liver. Pancreatic tail was  obscured, otherwise  negative. White count 6.4, hemoglobin 13.4, hematocrit  39.7, platelets 436,000. INR 1.0. Sodium 142, potassium 3.3, BUN 12,  creatinine 0.7. LFTs were normal. CK-MB and troponins were negative.  Cholesterol 144, triglycerides 67, HDL 46, LDL 85. TSH 0.395.   DISPOSITION:  The patient is discharged in stable condition and will follow  up with Dr. Jenne Campus after an outpatient Cardiolite study.      Abelino Derrick, P.A.      Darlin Priestly, MD  Electronically Signed    LKK/MEDQ  D:  07/15/2005  T:  07/15/2005  Job:  [id number]   cc:   Darlin Priestly, MD  Fax: [phone]

## 2011-01-23 NOTE — Discharge Summary (Signed)
[place]  Patient:    MARLON, VONRUDEN                       MRN: [id number] Adm. Date:  [id number] Disc. Date: [id number] Attending:  Gwynneth Aliment CC:         Lenise Herald, M.D.   Discharge Summary  HISTORY OF PRESENT ILLNESS:  Ms. Lorita Forinash is a 60 year old African-American female with a past medical history significant for coronary artery disease status post stent placement, hypertension, hyperlipidemia, and adenocarcinoma of the lung who presented to Dr. Loraine Grip office with chest pain.  Evaluation revealed a normal EKG; however, the patient was in respiratory distress so she was sent to the ER for further evaluation. Patient described the pain as substernal, radiating to the left side of her chest, throbbing, and associated with nausea, palpitations, and shortness of breath.  Other symptoms include cough productive of yellow sputum, fever, chills, myalgias, and fatigue.  She admitted to noncompliance to her medications because of financial restraints.  ER evaluation was significant for one run of ventricular tachycardia.  She was admitted to telemetry for further evaluation.  PAST MEDICAL HISTORY:  Coronary artery disease status post stent placement in the RCA, recurrent angina without EKG changes, hypertension, lung cancer diagnosed in 1999, hypercholesterolemia.  PAST SURGICAL HISTORY:  Left upper lobectomy secondary to adenocarcinoma.  MEDICATIONS: 1. Advair 100/50 one inhalation b.i.d. 2. Lipitor 10 p.o. q.d. 3. Imdur 30 mg p.o. q.d. 4. Plavix 75 mg p.o. q.d. 5. Nitroglycerin sublingual p.r.n. 6. Toprol XL 25 mg p.o. q.d.  ALLERGIES:  ASPIRIN which caused itching.  FAMILY HISTORY:  Mother who was deceased in her 69s secondary to cerebrovascular disease, hypertension, coronary artery disease, and diabetes. Father deceased in his 34s - medical history unknown.  She has a brother with diabetes.  There is no known family  history of cancer.  SOCIAL HISTORY:  She lives with her husband and has a 20 pack-year history of tobacco use.  Denies alcohol, marijuana, and illicit drug use.  REVIEW OF SYSTEMS:  Fatigue, decreased appetite, and insomnia secondary to persistent cough, generalized weakness.  She denied paresthesias; otherwise, all general systems negative.  PHYSICAL EXAMINATION:  VITAL SIGNS:  On admission, her vitals with a temperature of 99.5, heart rate 108, respiratory rate 22, blood pressure 128/79 with a pulse oximetry of 94% on 2 L.  GENERAL:  This was a well-developed female who was resting comfortably at that time, however, she was easily aroused.  HEENT:  Normocephalic, atraumatic.  Anicteric sclerae.  Pupils equally round and reactive to light.  Full extraocular movements intact without nystagmus and there was no sinus tenderness to percussion.  No oropharyngeal lesions noted either, however, she did have poor dentition.  NECK:  There was no JVD, no carotids bruits.  CHEST:  Scattered rhonchi with decreased breath sounds and inspiratory and expiratory wheezing.  CARDIOVASCULAR:  She was tachycardic, no ectopy was noted.  ABDOMEN:  She had a healed surgical scar, distended, soft, normal abdominal bowel sounds.  RECTAL:  Deferred.  EXTREMITIES:  There was no edema, clubbing, or cyanosis.  NEUROLOGIC:  She was awake, alert, oriented x 3.  Cranial nerves II-XII grossly intact, tongue was midline, normal speech, and she moved all of her extremities.  LABORATORIES ON ADMISSION:  White blood count of 17.3, neutrophil count of 87%.  CPK: Initial CPK of 48 and troponin was less than 0.01.  Chest x-ray was significant  for a left pleural effusion.  EKG was normal sinus rhythm at 70 without acute ST-T wave changes.  ASSESSMENT:  This was a 60 year old female with a past medical history significant for CAD, hypertension, recurrent angina, lung cancer who presented with substernal  chest pain for chest pain rule out MI.  HOSPITAL COURSE:  She was admitted to telemetry, however the suspicion for cardiac disease cause for the pain was low; however, with her cardiac risk factors including hypercholesterolemia, past history of coronary artery disease, tobacco use, and family history, she was admitted to telemetry with check of three CPKs to rule her out.  She was continued on Plavix, Toprol XL, Lipitor, and Imdur and she was to be followed by Dr. Jenne Campus.  The dyspnea was likely secondary to an asthma exacerbation.  She was given Solu-Medrol 125 mg IM x 1.  In view of the leukocytosis, she was started on Tequin 400 mg IV q.d. and she was given albuterol and Atrovent mini nebs as needed.  The importance of tobacco cessation was discussed with the patient at length.  She is unable to afford nicotine patches and has tried Zyban in the past without success. She was then advised to try to decrease the number of cigarettes smoked per day.  The next day the patient had ruled out for cardiac MI.  She was without chest pain overnight however, complained of a cough productive of yellow sputum.  ABG at that time significant for an oxygenation of 88% on room air. The decision was made to continue her on 2 L of oxygen, IV Tequin, and the steroids.  Hypertension was controlled with a blood pressure 98/54.  She then complained of constipation and she was given an enema.  She had no further arrhythmias.  On the following day, on September 03, 2001 she felt much better. Her pulse oximetry was 98% on room air.  With regards to her asthma, her condition was improved and stable.  She was discharged on a prednisone taper, Tequin to complete an antibiotic course of 10 days, Advair 250/50 mg one inhalation b.i.d. with an albuterol metered-dose inhaler or nebulizer as needed.  She was discharged home in stable condition.  She was to followup with Dr. Allyne Gee on September 13, 2000 and with Dr.  Jenne Campus as needed.  DISCHARGE DIAGNOSES:  1. Asthma exacerbation. 2. Hypertension. 3. Constipation. 4. Hypercholesterolemia. 5. Coronary artery disease. 6. History of lung cancer.  DISCHARGE MEDICATIONS: 1. Prednisone taper. 2. Tequin 400 mg p.o. q.d. 3. Advair 250/50. 4. Albuterol MDI. 5. Plavix 75 mg p.o. q.d. 6. Lipitor 10 mg p.o. q.d. 7. Toprol XL 25 mg p.o. q.d. 8. Imdur 30 mg p.o. q.d. DD:  09/24/00 TD:  09/25/00 Job: [id number]

## 2011-01-23 NOTE — Cardiovascular Report (Signed)
. [facility]  Patient:    [name], [name]                       MRN: [id number] Proc. Date: 01/19/01 Adm. Date:  [id number] Attending:  Ruta Hinds CC:         Velna Hatchet, M.D.   Cardiac Catheterization  PROCEDURES: 1. Left heart catheterization. 2. Coronary angiography. 3. Left ventriculogram. 4. Right coronary artery - Cutting Balloon angioplasty.  COMPLICATIONS:  None.  INDICATIONS:  Ms. Fulgham is a 60 year old black female, with a history of CAD, status post PTCA and stenting of her RCA in Feb 03, 2001.  The patient was readmitted on April 27, 2000, with recurrent chest pain and found to have in-stent re-stenosis of her proximal RCA stent.  The patient continues to smoke and was recently admitted again with recurrent angina.  She is now referred back for cardiac catheterization.  DESCRIPTION OF PROCEDURE:  After given informed written consent, the patient was brought to the cardiac catheterization lab where her right and left groins were shaved, prepped, and draped in the usual sterile fashion.  ECG monitoring was established.  Using modified Seldinger technique, a #6 French arterial sheath was inserted in the right femoral artery.  A 6 French diagnostic catheter was then used to perform diagnostic angiography.  This reveals a large left main with no significant disease.  The LAD is a large vessel which courses around the apex and gives rise to two diagonal branches.  The LAD had no significant disease.  The first and second diagonals are medium sized vessels with no significant disease.  The left circumflex is a large vessel which coursed in the AV groove and gave rise to three obtuse marginal branches.  The AV groove circumflex has no significant disease.  The first OM is a large vessel with no significant disease.  The second OM is a medium sized vessel with no significant disease. The third OM is a large vessel which  bifurcates distally and has no significant disease.  The right coronary artery is a medium sized vessel which is noted to be nondominant with a stent in its early mid segment.  There is a 99% diffuse in-stent re-stenosis noted.  LEFT VENTRICULOGRAM:  The left ventriculogram reveals preserved EF at 60%.  HEMODYNAMICS:  Systemic arterial pressure 135/81, LV systemic pressure 135/18, LVEDP of 24.  INTERVENTIONAL PROCEDURE:  Right coronary artery - mid.  Following diagnostic angiography, a #7 [place] JR4 guiding catheter with side holes was coaxially engaged in the right coronary ostium.  Selective angiogram was then performed. Next, a 0.014 short, Patriot guide wire was advanced out of the guiding catheter and positioned in the distal RCA without difficulty.  Next, we attempted to pass a 2.5 x 15 mm Cutting Balloon into the in-stent stenotic segment.  However, this would not pass into the significant stenotic area. This balloon was eventually removed and a CrossSail 2.5 x 10 mm balloon was then used to cross the in-stent stenotic lesion.  Three subsequent inflations to a maximum of 6 atmospheres was then performed for a total of approximately 1 minute and 15 seconds.  Followup angiogram revealed excellent luminal gain. This balloon was then removed and the 2.5 x 15 mm Cutting Balloon was then inserted into the in-stent stenotic lesion.  Two subsequent inflations to a maximum of 6 atmospheres then performed for a total of 2 minutes.  Followup angiogram revealed no evidence of  dissection or thrombus or TIMI-3 flow to the distal vessel.  Integrilin was given.  Intravenous doses of heparin were given to maintain the ACT within 200 and 300.  Final orthogonal angiograms reveal less than 10% residual stenosis in the early mid in-stent stenotic lesion.  At this point, it was elected to conclude the procedure.  All balloons, wires, and catheters removed.  Hemostatic sheaths were sewn in place and  the patient was transferred back to the ward in stable condition.  CONCLUSIONS: 1. Successful percutaneous transluminal coronary balloon angioplasty and    Cutting Balloon angioplasty of the early mid right coronary artery    in-stent stenotic lesion. 2. Normal left ventricular systolic function. DD:  01/19/01 TD:  01/19/01 Job: [id number]

## 2011-01-23 NOTE — Cardiovascular Report (Signed)
[place]. [facility]  Patient:    [name], [name] Visit Number: [id number] MRN: [id number]          Service Type: MED Location: [id number] Attending Physician:  Darlin Priestly Dictated by:   [name], M.D. Proc. Date: 10/31/01 Admit Date:  10/29/2001   CC:         Velna Hatchet, M.D.  [facility], Office  Cardiac Catheterization Lab   Cardiac Catheterization  INDICATIONS:  Ms. [name] is a 60 year old black female with a history of known CAD.  She is status post initial stenting of her proximal small right coronary artery in May 2001.  She was admitted in August with recurrent chest pain and developed in-stent restenosis.  She continued to smoke cigarettes. She underwent successful cutting balloon intervention in August 2001 and again in May 2002 for in-stent restenosis.  We felt that the vessel was too small for brachytherapy.  The patient had done well.  Unfortunately she stopped taking her medications.  She still smokes and has been noncompliant.  She presented at [facility] on October 29, 2001, with recurrent chest pain suggesting unstable angina.  She was treated with nitroglycerin and heparin and scheduled for repeat catheterization.  DESCRIPTION OF PROCEDURE:  After premedication with Valium intravenously, the patient was prepped and draped in the usual fashion.  The right femoral artery was punctured anteriorly and a #6 French sheath was inserted without difficulty.  Diagnostic catheterization was done with the #6 French Judkins #4 left and right coronary catheters.  A #6 French pigtail catheter was used for biplane cine left ventriculography.  With the demonstration of 90% stenosis at the distal portion of the mid right coronary artery extending beyond the stent, the decision was made to attempt cutting balloon intervention with probable tandem stenting.  Previously the patient had received ReoPro as well as  Integrilin for glycoprotein IIb/IIIa inhibition.  Aggrastat was therefore administered, bolus and infusion.  The patient would receive 4500 units of weight-adjusted heparin after her sheath was upgraded to a #7 [place] system. With documentation of therapeutic anticoagulation, the Patriot wire was advanced down the right coronary artery.  A 2.5- x 10-mm cutting balloon atherotomy catheter was then inserted.  Several dilatations were made up to a maximum of 5 atmospheres with the balloon distal to the stented segment.  The balloon was then pulled entirely into the stented segment and dilatation more aggressively up to 8 to 9 atmospheres was performed.  A 2.25- x 8-mm Pixel stent was then inserted with mild overlap in tandem distally to the previously placed stent.  This was dilated sequentially up to 13 atmospheres corresponding to a 2.35 vessel size.  The balloon was gradually moved more proximally into the entire stented segment with dilatation of 14 and then 15, and ultimately, 16 atmospheres within the initial stented segment.  Scouty angiography confirmed an excellent angiographic result.  The arterial sheath was sutured in place with plans for sheath removal later today.  HEMODYNAMIC DATA: 1. Central aortic pressure was 178/95. 2. Left ventricular pressure was 178/30.  ANGIOGRAPHIC DATA: 1. The left main coronary artery is angiographically normal and trifurcated    into an LAD and intermedius, then left circumflex system.  2. The LAD seemed to have a smooth 40-50% area in its mid segment which    seemed to perhaps intramyocardially have a component of muscle bridging.    Intracoronary nitroglycerin, 400 mcg (200 mcg x 2), was then  administered    down the LAD.  Scouty angiography again showed mild residual narrowing with    improvement with residual narrowing of 20-30% in this segment.  The LAD    wrapped around the apex.  3. The intermedius vessel was angiographically  normal.  4. The circumflex vessel was angiographically normal.  5. The right coronary artery was a small nondominant vessel.  There was    jailing of a small anterior marginal branch of 70-80% within the stented    segment.  In the distal aspect of the stent, there was 90% stenosis which    seemed to extend beyond the distal aspect of the stent into the native    vessel.  Several doses of intracoronary nitroglycerin were administered    down the RCA with continued stenosis.  Following cutting balloon    atherotomy and tandem stenting with a 2.5- x 8-mm Pixel stent, the entire    stented segments were reduced to 0%.  There was TIMI-3 flow.  There was no    evidence for dissection.  BIPLANE CINE LEFT VENTRICULOGRAPHY:  Done prior to the coronary intervention, showed normal LV function.  There was subtle mid mild diaphragmatic hypocontractility in the RAO projection.  There was normal contractility in the LAO projection.  IMPRESSION: 1. Normal left ventricular function with subtle mid diaphragmatic    hypocontractility. 2. Mild smooth 40-50% mid narrowing in the left anterior descending artery    with systolic bridging, improved with intracoronary nitroglycerin    administration to approximately 20-30%. 3. Normal intermedius vessel. 4. Normal left circumflex coronary artery. 5. Small nondominant right coronary artery with 90% distal in-stent restenosis    extending beyond the stented segment in the proximal right coronary    artery. 6. Successful cutting balloon atherotomy and tandem stenting done with a 2.25-    x 8-mm stent dilated up to 2.35 mm done with Aggrastat and weight-adjusted    heparinization as well as Plavix administration.Dictated by:   [name], M.D. Attending Physician:  Darlin Priestly DD:  10/31/01 TD:  10/31/01 Job: [id number]

## 2011-01-23 NOTE — Discharge Summary (Signed)
NAME:  [name], [name] NO.:  [id number]   MEDICAL RECORD NO.:  [id number]                   PATIENT TYPE:  INP   LOCATION:  6529                                 FACILITY:  [facility]   PHYSICIAN:  Madaline Savage, M.D.             DATE OF BIRTH:  1951/02/01   DATE OF ADMISSION:  11/05/2002  DATE OF DISCHARGE:  11/07/2002                                 DISCHARGE SUMMARY   HISTORY OF PRESENT ILLNESS:  The patient is a 60 year old African American  female with prior history of coronary artery disease.  She had a stent  placed to her RCA May 2001.  She had in-stent restenosis and had a cutting  balloon procedure August 2001.  She again had in-stent restenosis May 2002,  at which time she had a cutting balloon.  In February 2003, she had in-stent  restenosis.  She had a cutting balloon and another stent placed tandemly to  her older stent.  She was admitted with unstable angina.  Her CK-MB's were  negative x3.  She continues to smoke; however, she has decreased her smoking  from three packs a day to one pack per day.  She is noncompliant with taking  her medications.  She states it is due to financial issues; however, she was  on disability and is on Medicaid.   HOSPITAL COURSE:  She was seen by Dr. Kem Boroughs with decision to admit  her, rule out an MI, for a possible catheterization and a 2-D  echocardiogram.  She underwent catheterization by Dr. Elsie Lincoln on March 1.  She had been placed on IV heparin and IV nitroglycerin.  Her catheterization  showed that she had 90% in-stent restenosis.  She underwent cutting balloon.  It was reduced to 0%.  She had no other coronary disease.  Her EF was 50%.  She tolerated the procedure well.  On the morning of November 07, 2002, she had  had no further chest pain.  Her blood pressure ranged from 124/78 to  130/104.  Her heart rate was 78-82, respirations were 20, her room air  saturations were 96%.  Labs showed a  hemoglobin of 13, hematocrit of 37.1,  platelets of 293, and WBC of 5.4.  Her sodium was 139, potassium was 4.3,  BUN was 5, creatinine was 0.7, and glucose was 109.  Total cholesterol was  127, triglycerides 98, HDL of 50, LDL of 57, and VLDL was 20.  Her groin was  without bruise or hematoma.  She was not taking her aspirin because she  states she is intolerant to it and it makes her itch and she had been  started on Plavix.  Of note, she does have significant asthma but she is not  on a beta-blocker.  She was seen by the smoking cessation consultant.   DISCHARGE MEDICATIONS:  1. Plavix 75 mg once a day.  2. Altace 5  mg once a day.  3. Zocor 10 mg at bedtime every night.  4. Nitroglycerin 1/150 under tongue every five minutes x3 as needed for     chest pain.   ACTIVITY:  She is to do no strenuous activity, no lifting, no heavy  housework, no driving for four days.   DIET:  She should be on a low saturated fat diet.   DISCHARGE INSTRUCTIONS:  She should continue to try and quit smoking.   FOLLOW UP:  She will follow up with Dr. Chanda Busing March 26 at 12:25.   Prescriptions were written.  A case management consult was called and will  be done prior to her discharge to make an assessment about any financial  needs and her ability to buy her prescription medicines with her Medicaid  card.  The patient also requested home health followup.  We will see if this  appropriate for her level of disability.    DISCHARGE DIAGNOSES:  1. Unstable angina with known coronary artery disease, status post     catheterization November 06, 2002 with 90% stenosis to her right coronary     artery stent with subsequent cutting balloon.  2. Arteriosclerotic cardiovascular disease, single vessel disease in her     right coronary artery with initial stent placed May 2001.  Multiple in-     stent restenosis and then another stent placed February 2003 tandemly to     her old right coronary artery stent,  now with in-stent restenosis.  3. Noncompliance due to financial problems, to be assessed this admission.  4. ASPIRIN intolerance.  5. Tobacco use.  6. Normal ejection fraction.  7. Hypertension.  8. Significant asthma not on beta-blocker.     Lezlie Octave, N.P.                        Madaline Savage, M.D.    BB/MEDQ  D:  11/07/2002  T:  11/07/2002  Job:  [id number]

## 2011-01-23 NOTE — Cardiovascular Report (Signed)
[place]. [facility]  Patient:    [name], [name]                         MRN: [id number] Proc. Date: 04/27/00 Adm. Date:  [id number] Attending:  Lenise Herald H CC:         [name], M.D.             Dr. Lelon Perla                        Cardiac Catheterization  PROCEDURES: 1. Left heart catheterization. 2. Coronary angiography. 3. Left ventriculogram. 4. Right coronary artery--proximal.    a. Cutting Balloon.    b. Percutaneous transluminal coronary balloon angioplasty.  COMPLICATIONS:  None.  INDICATIONS:  Ms. [name] is a 60 year old black female, patient of Dr. Daphene Jaeger and Dr. Lelon Perla, with a history of hypertension, tobacco abuse, status post PTCA and stenting of her proximal RCA, May of 2001.  The patient continues to smoke.  She recently presented to the office on April 23, 2000, with a complaint of crescendo angina.  She is now referred for repeat catheterization redefine her coronary anatomy.  DESCRIPTION OF PROCEDURE:  After given informed written consent, the patient was brought to the cardiac catheterization lab where right and left groins were shaved, prepped, and draped in the usual sterile fashion.  ECG monitoring was established.  Using modified Seldinger technique, a #6 French arterial sheath was inserted in the right femoral artery.  A 6 French diagnostic catheter was then used to perform diagnostic angiography.  This revealed a medium sized left main with no significant disease.  The LAD is a large vessel which courses around the apex and gave rise to two diagonal branches.  The LAD proper has no significant disease.  The first and second diagonals are medium sized vessels with no significant disease.  The left coronary artery also gives rise to a medium sized ramus intermedius with no significant disease.  The left circumflex is a medium sized vessel which coursed in the AV groove and gives rise to two obtuse marginal  branches.  The AV groove circumflex has no significant disease.  The first OM is a medium sized vessel with no significant disease.  The second OM is a small vessel with no significant disease.  The right coronary artery is a medium sized vessel which appears to be codominant and has a stent in its proximal portion.  There is diffuse 99% in-stent re-stenosis in the proximal RCA.  The remainder of the RCA has no significant disease.  LEFT VENTRICULOGRAM:  The left ventriculogram revealed a preserved EF measured at 60%.  There is significant ectopy but there does not appear to be significant mitral regurgitation.  HEMODYNAMICS:  Systemic arterial pressure 150/88, LV systemic pressure 140/16, LVEDP of 22.  INTERVENTIONAL PROCEDURE:  Following diagnostic angiography a #6 French arterial sheath was then exchanged for a #7 [place] arterial sheath and a 7 [place] JR 3.5 guiding catheter with side holes was then coaxially engaged in the right coronary ostium.  Selective coronary angiogram was then performed. Next, a 0.014 [employer]-length guidewire was advanced out of the guide catheter into the proximal RCA.  The guidewire was then used to cross the proximal in-stent stenotic lesion and positioned in the RCA without difficulty.  Next, a Sci-Med Cutting Balloon 2.5 x 10 mm balloon was then tracked over the guidewire and positioned across the  in-stent restenotic lesion.  Three subsequent inflations to a maximum of 8 atmospheres was performed for a total of 2 minutes and 30 seconds.  Followup angiogram reveals no evidence of dissection or thrombus with persistent irregularities in the proximal portion of the stent.  This balloon was then exchanged for a Sci-Med [place] Ranger 2.5 x 9 mm balloon which was then tracked into the proximal RCA lesion.  Two subsequent inflations to a maximum of 14 atmospheres was then performed for a total of 2 minutes and 6 seconds.  Followup angiogram revealed no  evidence of dissection or thrombus, TIMI-3 flow in the distal vessel and an excellent luminal gain.  We elected to proceed with ReoPro administration. Intravenous doses of heparin were given to maintain the ACT between 200 and 300.  Final orthogonal angiograms revealed less than 10% residual stenosis in the proximal RCA in-stent stenotic lesion with TIMI-3 flow to the distal vessel and no evidence of dissection or thrombus.  At this point we elected to compete the procedure.  All balloons, wires, and catheters removed. Hemostatic sheath was sewn in place, and the patient was transferred back to the ward in stable condition to continue with ReoPro infusion to complete 12 hours.  CONCLUSIONS: 1. Status post successful Cutting Balloon and percutaneous transluminal    coronary balloon angioplasty of the proximal right coronary artery in-stent    stenotic lesion. 2. Normal left ventricular systolic function. 3. Systemic hypertension. 4. Adjunct use of ReoPro (abciximab) infusion. DD:  04/27/00 TD:  04/28/00 Job: [id number]

## 2011-01-23 NOTE — Cardiovascular Report (Signed)
[place]. [facility]  Patient:    [name], [name]                         MRN: [id number] Proc. Date: 02/04/00 Adm. Date:  [id number] Disc. Date: [id number] Attending:  Lenise Herald H CC:         Dr. Deatra Ina, M.D.             Richard A. Alanda Amass, M.D.             Cardiac Catheterization Lab             Orville Govern, CVTS Office                        Cardiac Catheterization  INDICATIONS:  Ms. Patricia Fargo is a 60 year old black female with a history of hypertension, tobacco use, asthma; as well as a history of adenocarcinoma of the lung, status post left upper lobectomy.  The patient has continued to smoke cigarettes.  She presented to [employer] yesterday with chest pain and throat pain, suggestive of possible unstable angina.  She is now referred for catheterization.  HEMODYNAMIC DATA: 1. Central aortic pressure:  147/90. 2. Left ventricular pressure:  147/28.  ANGIOGRAPHIC DATA: 1. LEFT MAIN CORONARY ARTERY:  Angiographically normal and trifurcated into    an LAD, intermediate and left circumflex system. 2. LEFT ANTERIOR DESCENDING CORONARY ARTERY:  This was a large vessel that    extended and wrapped around the apex.  This supplied a distal inferior    wall.  There was mild 40% smooth narrowing of the middle LAD after a    diagonal takeoff.  Following 200 mcg of intracoronary nitroglycerin, this    did improve to probably less than 25%, suggesting a component of spasm. 3. RAMUS INTERMEDIATE:  Angiographically normal. 4. CIRCUMFLEX:  Angiographically normal and gave rise to a major marginal    vessel, ended in a posterolateral vessel. 5. RIGHT CORONARY ARTERY:  A nondominant vessel that had 80% stenosis    proximally, just after the takeoff of the ______ artery.  Intracoronary    nitroglycerin was administered down the RCA, with no change in the    stenosis; with residual narrowing still of  80%.  LEFT VENTRICULOGRAPHY:  Biplane cineangiographic ventriculography revealed global LV function, with mild mid-diaphragmatic and mid posterolateral hypocontractility.  DISTAL AORTOGRAPHY:  Did not demonstrate any renal artery stenosis.  There was no significant aortoiliac disease.  The decision was made to attempt coronary intervention of the right coronary artery.  INTERVENTIONAL PROCEDURE:  The sheath was exchanged ultimately to a 7-French system.  An FR4 3.5 with side hole guide was used.  A ReoPro bolus plus infusion was administered, and 5000 units of weight-adjusted heparin was given.  Throughout the procedure, the patient was very "antsy".  She also coughed significantly, and seemed to move a fair amount.  She required several additional doses of IV Valium for sedation, and also was given 4 mg IV morphine.  Because of also a mild itch, she was given 25 mg Benadryl.  A BMW wire was advanced down the RCA.  Initial dilatation was done with a 2.0 x 15 mm CrossSail balloon.  This was dilated ultimately up to 14 atm sequentially.  There was still significant elastic recoil, in spite of the numerous inflations and  numerous doses of intracoronary nitroglycerin.  This was then upgraded to a 2.25 x 15 mm CrossSail.  Additional inflations were also made at 6 and 8 atm.  Again, significant elastic recoil was present.  At this point, decision was made to place a 2.25 x 13 mm BX-velocity stent. This was deployed up to 14 atm, corresponding to a 2.4 mm vessel size.  Scout angiography confirmed an excellent angiographic result.  The 80% stenosis was reduced to 0%.  There was no evidence for dissection.  There was TIMI-3 flow.  IMPRESSION: 1. Normal global LV function, with mild mid posterior and posterolateral    hypocontractility. 2. Mid LAD narrowing of 40%, which improved to less than 20% following IC    nitroglycerin administration. 3. Proximal RCA stenosis of 80% in the  nondominant right coronary artery,    which did not improve following IC nitroglycerin administration. 4. Successful, but difficult, PTCA/ultimate stenting of the RCA, due to    significant elastic recoil; utilizing 2.0 x 15 mm, 2.25 x 15 mm CrossSail    balloons.  Also ultimately 2.25 x 13 mm BX-velocity stent dilated to    2.4 mm. DD:  02/04/00 TD:  02/05/00 Job: [id number]

## 2011-01-23 NOTE — Discharge Summary (Signed)
[name], WINTON NO.:  [id number]   MEDICAL RECORD NO.:  [id number]          PATIENT TYPE:  INP   LOCATION:  2027                         FACILITY:  [facility]   PHYSICIAN:  Thereasa Solo. Little, M.D. DATE OF BIRTH:  05-02-1951   DATE OF ADMISSION:  04/20/2005  DATE OF DISCHARGE:  04/21/2005                                 DISCHARGE SUMMARY   DISCHARGE DIAGNOSES:  1.  Chest pain consistent with unstable angina, myocardial infarction ruled      out this admission.  2.  Coronary disease with right coronary artery stent placement in April      2001, by subsequent catheterization showing moderate in-stent restenosis      to this vessel.  3.  Treated hypertension.  4.  Treated hyperlipidemia.  5.  Smoking.  6.  Aspirin intolerance and allergy with itching.   HOSPITAL COURSE:  The patient is a 60 year old female who developed chest  pain and called EMS and was admitted through the emergency room.  She had  chest pain worrisome for unstable angina.  She took two nitroglycerin  without relief and called EMS.  She improved in the emergency room.  Her EKG  is normal.  She was seen by Dr. Clarene Duke.  She was admitted to telemetry and  put on IV heparin.  Subsequent enzymes were negative x2.  The next morning  she had some nausea.  She had mild right upper quadrant tenderness.  She had  a gallbladder ultrasound which was negative.  Plan was to send her home  later on the 15th and have her follow up in the office for a Cardiolite  study.  The patient was discharged April 21, 2005.   DISCHARGE MEDICATIONS:  1.  Lotrel 10/20 mg once a day.  2.  Toprol XL 25 mg a day.  3.  Protonix 40 mg once a day for one month, then Prilosec OTC.   She will call office to follow up with Dr. Jenne Campus.   LABORATORY DATA:  EKG shows sinus rhythm without acute changes.  Chest x-ray  shows postop changes of the left lung and no acute pulmonary process.  Abdominal ultrasound showed some diffuse  fatty infiltration of the liver, no  obvious cholecystitis.  White count 6.4, hemoglobin 13.4 medical 39.7,  platelets 436.  INR of 1.0.  Sodium 142, potassium 3.3, BUN 12, creatinine  0.7.  LFTs were normal.  C, MB and troponins were negative.  Lipid profile  shows a cholesterol of 144 LDL is 85, HDL 46.  TSH 0.39.   DISPOSITION:  The patient is discharged in stable condition and will follow  up with Dr. Jenne Campus in the office.  She will need an outpatient Cardiolite.      Abelino Derrick, P.A.    ______________________________  Thereasa Solo. Little, M.D.    Lenard Lance  D:  07/09/2005  T:  07/09/2005  Job:  [id number]   cc:   Darlin Priestly, MD  Fax: [phone]

## 2011-01-23 NOTE — Op Note (Signed)
NAME:  [name], FUERTE NO.:  [id number]   MEDICAL RECORD NO.:  [id number]                   PATIENT TYPE:  AMB   LOCATION:  DSC                                  FACILITY:  [facility]   PHYSICIAN:  Deidre Ala, M.D.                 DATE OF BIRTH:  1951/04/10   DATE OF PROCEDURE:  07/17/2003  DATE OF DISCHARGE:                                 OPERATIVE REPORT   PREOPERATIVE DIAGNOSIS:  Right fifth hammertoe with painful osteophyte PIP  joint.  Left fifth hammertoe with painful osteophyte PIP joint right greater  than left.   POSTOPERATIVE DIAGNOSIS:  Right fifth hammertoe with painful osteophyte PIP  joint.  Left fifth hammertoe with painful osteophyte PIP joint right greater  than left.   PROCEDURE:  Right fifth hammertoe correction using the DuVries PIP  resection.  Left foot fifth toe hammertoe correction using the DuVries PIP  joint resection.   SURGEON:  Bradley Ferris, M.D.   ASSISTANT:  Madilyn Fireman, P.A.-C. and Alease Frame, P.A.-Student.   CULTURES:  None.   DRAINS:  None.   ESTIMATED BLOOD LOSS:  Minimal.   ANESTHESIA:  General with LMA.   TOURNIQUET TIME:  Right ankle Esmarch 11 minutes, left 9 minutes.   PATHOLOGY:  The patient had curling under toes with painful prominent PIP  joints bilaterally of her fifth toes probably with shoe wear.  These were  classically fifth curly/hammertoes at the PIP joint.  We were able to  derotate them, bring them up from underneath the fourth on both sides,  resect the joint, and remove the prominent PIP joint bilaterally with the  DuVries procedure.   DESCRIPTION OF PROCEDURE:  With adequate anesthesia obtained using LMA  technique, 1 gram of Ancef given IV prophylaxis, the patient was placed in  the supine position.  Both feet were prepped from the toes to the calf in  the standard fashion.  After standing prepping and draping on the right,  Esmarch exsanguination was used and the tourniquet  was left on around the  ankle.  I then took a dorsal wedge of skin on the anterolateral PIP joint of  the fifth toe and excised it in the classic example of DuVries.  I then  removed the extensor tendon, resected the joint with a sharp bone rongeur,  and then released the flexor tendon.  Irrigation was carried out.  I then  brought the limb of the wound with the resected skin back to itself, closing  down the osteotomy and derotating the toe to a physiologic position and out  from underneath the curled under fifth to fourth position.  I then sutured  over two Xeroflow Bolsters with 4-0 nylon suture and hence straightening the  toe with 4-0 nylon sutures placed.  An additional suture was placed just  medial to the Bolster suture.  WE then placed 0.5%  Marcaine intermetacarpal  block about 10 mL and did a bulky forefoot dressing with Ace.  Attention was  then turned to the left foot where the exact same procedure was carried out  on the PIP joint with DuVries straightening and the Bolsters used.  The same  dressing was applied.  The patient then was awakened and taken to the  recovery room in satisfactory condition to be discharged per outpatient  routine.  Crutches, weightbearing as tolerated on wooden sole shoes,  elevation, given Percocet for pain, told to call the office for appointment  for recheck on Monday.  Laboratory data within normal limits.  Appropriateness for outpatient surgery is classic toe surgery for  outpatient.                                               Deidre Ala, M.D.    VEP/MEDQ  D:  07/17/2003  T:  07/18/2003  Job:  [id number]

## 2011-01-23 NOTE — Cardiovascular Report (Signed)
NAME:  [name], [name] NO.:  [id number]   MEDICAL RECORD NO.:  [id number]                   PATIENT TYPE:  INP   LOCATION:  3708                                 FACILITY:  [facility]   PHYSICIAN:  Madaline Savage, M.D.             DATE OF BIRTH:  May 05, 1951   DATE OF PROCEDURE:  11/06/2002  DATE OF DISCHARGE:                              CARDIAC CATHETERIZATION   PROCEDURES PERFORMED:  1. Selective coronary angiography by Judkins technique.  2. Retrograde left heart catheterization.  3. Left ventricular angiography.  4. Cutting Balloon angioplasty of the mid right coronary artery for in-stent     restenosis.   COMPLICATIONS:  None.   ENTRY SITE:  Right femoral.   DYE USED:  Omnipaque.   PATIENT PROFILE:  The patient is a 60 year old African American female, who  has had multiple previous interventions on her right coronary artery, the  first being August of 2001, the second being May 2002, the last being  February of 2003.  At that time a stent was placed to that vessel. The  patient presented with chest pain recently without evidence of myocardial  infarction.  She presents to the catheterization lab today electively for  diagnostic cardiac catheterization.   RESULTS:  PRESSURES:  The left ventricular pressure was 148/18, end-  diastolic pressure 25, central aortic pressure 145/85, mean of 110.  No  significant aortic valve gradient by pullback technique.   ANGIOGRAPHIC RESULTS:  1. The left main coronary artery was normal.  2. The left anterior descending coronary artery coursed to the cardiac apex     and was a large dominant vessel that wrapped around the apex and supplied     the inferior and apical intraventricular septum.  No lesions were seen in     this vessel.  One diagonal branch arose from the LAD, which was normal.  3. The circumflex likewise was normal with two obtuse marginal branches     which were medium in size.  4. The  right coronary artery is small and nondominant.  It contains a     radiopaque stent.  In the midportion of the vessel there is a 90%     concentric in-stent restenosis of a radiopaque stent.  Distal runoff is     TIMI class 3, but the vessel is small in diameter, i.e. 2.5 mm.   LEFT VENTRICULOGRAM:  The left ventricle shows normal contractility,  ejection fraction 60% with trivial mitral regurgitation.   PERCUTANEOUS INTERVENTION:  This was performed using a 6 French catheter  system and a 6 French arterial sheath.  The patient received Angiomax and  obtained ACT value of 325 during the procedure. It was discontinued  afterwards.   The guide catheter utilized for this procedure was ultimately a 6 [place]  [id number] by [employer], also known as a Biomedical scientist right guide catheter  with side holes. This  was the only guide catheter of about seven different  ones used that was able to intubate the ostium of the vessel and direct the  guide wires in a superior orientation rather than inferior one, which was  the common denominator over all the other guides used.   The wire that was successful in completing this procedure was a Asahi medium  wire.  The balloon used was a 2.5 x 10 mm Cutting Balloon.  Two cuts were  made along the length of the vessel to peak inflation pressures of 9  atmospheres.  The lesion was reduced from 90% stenosis originally to 0%  residual with preservation of TIMI-3 flow.  The patient tolerated the  procedure well.  There were no rhythm or blood pressure complications during  the case and she really did not complain of chest pain, only low-back pain  which was treated with fentanyl and ultimately then with Demerol and  Phenergan.   FINAL DIAGNOSES:  1. In-stent restenosis of the right coronary artery stent.  2. Successful percutaneous Cutting Balloon angioplasty of the in-stent     restenosis in the mid right coronary artery with reduction of the 90%     lesion to  0%.                                               Madaline Savage, M.D.    WHG/MEDQ  D:  11/06/2002  T:  11/07/2002  Job:  [id number]   cc:   Darlin Priestly, M.D.  [phone], [address]  [place]  [place] [address]  Fax: [phone]   Cardiac Catheterization Lab

## 2011-06-11 LAB — DIFFERENTIAL
Basophils Absolute: 0.1 10*3/uL (ref 0.0–0.1)
Basophils Relative: 1 % (ref 0–1)
Lymphocytes Relative: 28 % (ref 12–46)
Monocytes Absolute: 0.4 10*3/uL (ref 0.1–1.0)
Neutro Abs: 4.7 10*3/uL (ref 1.7–7.7)
Neutrophils Relative %: 64 % (ref 43–77)

## 2011-06-11 LAB — CBC
HCT: 43 % (ref 36.0–46.0)
Hemoglobin: 14.7 g/dL (ref 12.0–15.0)
MCV: 94 fL (ref 78.0–100.0)
Platelets: 374 10*3/uL (ref 150–400)
RBC: 4.58 MIL/uL (ref 3.87–5.11)
WBC: 7.3 10*3/uL (ref 4.0–10.5)

## 2011-06-11 LAB — COMPREHENSIVE METABOLIC PANEL
Albumin: 3.5 g/dL (ref 3.5–5.2)
Alkaline Phosphatase: 87 U/L (ref 39–117)
BUN: 5 mg/dL — ABNORMAL LOW (ref 6–23)
CO2: 25 mEq/L (ref 19–32)
Chloride: 110 mEq/L (ref 96–112)
Creatinine, Ser: 0.6 mg/dL (ref 0.4–1.2)
GFR calc non Af Amer: >60 mL/min (ref >60)
Glucose, Bld: 133 mg/dL — ABNORMAL HIGH (ref 70–99)
Potassium: 3 mEq/L — ABNORMAL LOW (ref 3.5–5.1)
Total Bilirubin: 0.9 mg/dL (ref 0.3–1.2)

## 2011-06-11 LAB — URINALYSIS, ROUTINE W REFLEX MICROSCOPIC
Bilirubin Urine: NEGATIVE
Hgb urine dipstick: NEGATIVE
Nitrite: NEGATIVE
Protein, ur: NEGATIVE mg/dL
Specific Gravity, Urine: 1.01 (ref 1.005–1.030)
Urobilinogen, UA: 0.2 mg/dL (ref 0.0–1.0)

## 2011-06-11 LAB — LIPASE, BLOOD: Lipase: 20 U/L (ref 11–59)

## 2011-06-11 LAB — ETHANOL: Alcohol, Ethyl (B): 185 mg/dL — ABNORMAL HIGH (ref 0–10)

## 2011-06-22 LAB — DIFFERENTIAL
Basophils Absolute: 0
Basophils Relative: 0
Eosinophils Absolute: 0.1
Eosinophils Relative: 2
Lymphocytes Relative: 23
Lymphs Abs: 1.7
Monocytes Absolute: 0.5
Monocytes Relative: 7
Neutro Abs: 4.9
Neutrophils Relative %: 68

## 2011-06-22 LAB — URINE CULTURE
Colony Count: NO GROWTH
Culture: NO GROWTH

## 2011-06-22 LAB — COMPREHENSIVE METABOLIC PANEL
ALT: 16
AST: 17
Alkaline Phosphatase: 62
CO2: 24
Chloride: 109
GFR calc Af Amer: >60
GFR calc non Af Amer: >60
Potassium: 3.4 — ABNORMAL LOW
Sodium: 140
Total Bilirubin: 0.7

## 2011-06-22 LAB — URINALYSIS, ROUTINE W REFLEX MICROSCOPIC
Glucose, UA: NEGATIVE
Hgb urine dipstick: NEGATIVE
Ketones, ur: NEGATIVE
Protein, ur: NEGATIVE
Urobilinogen, UA: 0.2

## 2011-06-22 LAB — CBC
Hemoglobin: 12.9
RBC: 4.15
WBC: 7.3

## 2011-06-22 LAB — POCT CARDIAC MARKERS
CKMB, poc: <1 — ABNORMAL LOW
Myoglobin, poc: 33.1
Operator id: 4661
Troponin i, poc: <0.05

## 2015-11-21 ENCOUNTER — Emergency Department (HOSPITAL_COMMUNITY)
Admission: EM | Admit: 2015-11-21 | Discharge: 2015-11-21 | Disposition: A | Discharge status: Home / Self Care | Payer: Medicare Other | Attending: Emergency Medicine | Admitting: Emergency Medicine

## 2015-11-21 ENCOUNTER — Encounter (HOSPITAL_COMMUNITY): Payer: Self-pay

## 2015-11-21 ENCOUNTER — Emergency Department (HOSPITAL_COMMUNITY): Payer: Medicare Other

## 2015-11-21 DIAGNOSIS — Z79899 Other long term (current) drug therapy: Secondary | ICD-10-CM | POA: Diagnosis not present

## 2015-11-21 DIAGNOSIS — R609 Edema, unspecified: Secondary | ICD-10-CM

## 2015-11-21 DIAGNOSIS — Z7982 Long term (current) use of aspirin: Secondary | ICD-10-CM | POA: Diagnosis not present

## 2015-11-21 DIAGNOSIS — Z853 Personal history of malignant neoplasm of breast: Secondary | ICD-10-CM | POA: Diagnosis not present

## 2015-11-21 DIAGNOSIS — M545 Low back pain, unspecified: Secondary | ICD-10-CM

## 2015-11-21 DIAGNOSIS — R600 Localized edema: Secondary | ICD-10-CM | POA: Diagnosis not present

## 2015-11-21 DIAGNOSIS — Z85830 Personal history of malignant neoplasm of bone: Secondary | ICD-10-CM | POA: Diagnosis not present

## 2015-11-21 DIAGNOSIS — Z85118 Personal history of other malignant neoplasm of bronchus and lung: Secondary | ICD-10-CM | POA: Diagnosis not present

## 2015-11-21 DIAGNOSIS — I252 Old myocardial infarction: Secondary | ICD-10-CM | POA: Diagnosis not present

## 2015-11-21 DIAGNOSIS — Z9861 Coronary angioplasty status: Secondary | ICD-10-CM | POA: Insufficient documentation

## 2015-11-21 DIAGNOSIS — F17210 Nicotine dependence, cigarettes, uncomplicated: Secondary | ICD-10-CM | POA: Diagnosis not present

## 2015-11-21 DIAGNOSIS — Z9889 Other specified postprocedural states: Secondary | ICD-10-CM | POA: Insufficient documentation

## 2015-11-21 HISTORY — DX: Acute myocardial infarction, unspecified: I21.9

## 2015-11-21 HISTORY — DX: Malignant (primary) neoplasm, unspecified: C80.1

## 2015-11-21 MED ORDER — OXYCODONE-ACETAMINOPHEN 5-325 MG PO TABS
2.0000 | ORAL_TABLET | Freq: Once | ORAL | Status: AC
  Administered 2015-11-21: 2 via ORAL
  Filled 2015-11-21: qty 2

## 2015-11-21 MED ORDER — OXYCODONE-ACETAMINOPHEN 10-325 MG PO TABS: 1.0000 | ORAL_TABLET | Freq: Four times a day (QID) | ORAL | Status: AC | PRN

## 2015-11-21 MED ORDER — FUROSEMIDE 40 MG PO TABS
40.0000 mg | ORAL_TABLET | Freq: Once | ORAL | Status: AC
  Administered 2015-11-21: 40 mg via ORAL
  Filled 2015-11-21: qty 1

## 2015-11-21 NOTE — ED Notes (Addendum)
Per EMS, Pt, from home, c/o R Low back/flank pain and BLE swelling x "a couple days."  Pain score 10/10.  Hx of breast and bone CA and ran out of pain medication.  Sts she cannot get refill until later this month.  Pt has been evaluated by PCP for swelling and prescribed Lasix.  Pt reports no change w/ Lasix.    Pt is treated at [facility].  Pt takes oral chemo.

## 2015-11-21 NOTE — Discharge Instructions (Signed)
Back Pain, Adult °Back pain is very common in adults. The cause of back pain is rarely dangerous and the pain often gets better over time. The cause of your back pain may not be known. Some common causes of back pain include: °· Strain of the muscles or ligaments supporting the spine. °· Wear and tear (degeneration) of the spinal disks. °· Arthritis. °· Direct injury to the back. °For many people, back pain may return. Since back pain is rarely dangerous, most people can learn to manage this condition on their own. °HOME CARE INSTRUCTIONS °Watch your back pain for any changes. The following actions may help to lessen any discomfort you are feeling: °· Remain active. It is stressful on your back to sit or stand in one place for long periods of time. Do not sit, drive, or stand in one place for more than 30 minutes at a time. Take short walks on even surfaces as soon as you are able. Try to increase the length of time you walk each day. °· Exercise regularly as directed by your health care provider. Exercise helps your back heal faster. It also helps avoid future injury by keeping your muscles strong and flexible. °· Do not stay in bed. Resting more than 1-2 days can delay your recovery. °· Pay attention to your body when you bend and lift. The most comfortable positions are those that put less stress on your recovering back. Always use proper lifting techniques, including: °¨ Bending your knees. °¨ Keeping the load close to your body. °¨ Avoiding twisting. °· Find a comfortable position to sleep. Use a firm mattress and lie on your side with your knees slightly bent. If you lie on your back, put a pillow under your knees. °· Avoid feeling anxious or stressed. Stress increases muscle tension and can worsen back pain. It is important to recognize when you are anxious or stressed and learn ways to manage it, such as with exercise. °· Take medicines only as directed by your health care provider. Over-the-counter  medicines to reduce pain and inflammation are often the most helpful. Your health care provider may prescribe muscle relaxant drugs. These medicines help dull your pain so you can more quickly return to your normal activities and healthy exercise. °· Apply ice to the injured area: °¨ Put ice in a plastic bag. °¨ Place a towel between your skin and the bag. °¨ Leave the ice on for 20 minutes, 2-3 times a day for the first 2-3 days. After that, ice and heat may be alternated to reduce pain and spasms. °· Maintain a healthy weight. Excess weight puts extra stress on your back and makes it difficult to maintain good posture. °SEEK MEDICAL CARE IF: °· You have pain that is not relieved with rest or medicine. °· You have increasing pain going down into the legs or buttocks. °· You have pain that does not improve in one week. °· You have night pain. °· You lose weight. °· You have a fever or chills. °SEEK IMMEDIATE MEDICAL CARE IF:  °· You develop new bowel or bladder control problems. °· You have unusual weakness or numbness in your arms or legs. °· You develop nausea or vomiting. °· You develop abdominal pain. °· You feel faint. °  °This information is not intended to replace advice given to you by your health care provider. Make sure you discuss any questions you have with your health care provider. °  °Document Released: 08/24/2005 Document Revised: 09/14/2014 Document Reviewed: 12/26/2013 °Elsevier Interactive Patient Education ©2016 [employer]   [employer]  Peripheral Edema You have swelling in your legs (peripheral edema). This swelling is due to excess accumulation of salt and water in your body. Edema may be a sign of heart, kidney or liver disease, or a side effect of a medication. It may also be due to problems in the leg veins. Elevating your legs and using special support stockings may be very helpful, if the cause of the swelling is due to poor venous circulation. Avoid long periods of standing, whatever the  cause. Treatment of edema depends on identifying the cause. Chips, pretzels, pickles and other salty foods should be avoided. Restricting salt in your diet is almost always needed. Water pills (diuretics) are often used to remove the excess salt and water from your body via urine. These medicines prevent the kidney from reabsorbing sodium. This increases urine flow. Diuretic treatment may also result in lowering of potassium levels in your body. Potassium supplements may be needed if you have to use diuretics daily. Daily weights can help you keep track of your progress in clearing your edema. You should call your caregiver for follow up care as recommended. SEEK IMMEDIATE MEDICAL CARE IF:   You have increased swelling, pain, redness, or heat in your legs.  You develop shortness of breath, especially when lying down.  You develop chest or abdominal pain, weakness, or fainting.  You have a fever.   This information is not intended to replace advice given to you by your health care provider. Make sure you discuss any questions you have with your health care provider.   Document Released: 10/01/2004 Document Revised: 11/16/2011 Document Reviewed: 03/06/2015 Elsevier Interactive Patient Education [employer].

## 2015-11-21 NOTE — ED Provider Notes (Signed)
CSN: [id number]     Arrival date & time 11/21/15  1910 History   First MD Initiated Contact with Patient 11/21/15 1945     Chief Complaint  Patient presents with  . Back Pain  . Leg Swelling  . Cancer     (Consider location/radiation/quality/duration/timing/severity/associated sxs/prior Treatment) HPI Patient presents with greater than 1 week of bilateral lower extremity swelling. Seen by her primary physician 2 days ago and started on Lasix. Has had little improvement. She denies any shortness of breath or chest pain. Patient also presents with left lower back pain. This is been present for 2 days. No trauma or heavy lifting. Patient states she's been out of her chronic pain medication for the past 2 days as well. She takes the pain medication for metastatic cancer. Her normal dose is Percocet 10 mg every 8 hours. Denies any focal weakness or numbness to the lower extremities. No fever or chills. Past Medical History  Diagnosis Date  . MI (myocardial infarction) ([place])   . Cancer (HCC)     Lung, Breast, and Bone    Past Surgical History  Procedure Laterality Date  . Cardiac catheterization    . Coronary angioplasty with stent placement    . Rotator cuff repair Left    History reviewed. No pertinent family history. Social History  Substance Use Topics  . Smoking status: Current Every Day Smoker -- 0.50 packs/day    Types: Cigarettes  . Smokeless tobacco: None  . Alcohol Use: Yes     Comment: occ   OB History    No data available     Review of Systems  Constitutional: Negative for fever, chills and fatigue.  Respiratory: Negative for cough and shortness of breath.   Cardiovascular: Positive for leg swelling. Negative for chest pain and palpitations.  Gastrointestinal: Negative for nausea, vomiting, abdominal pain, diarrhea and constipation.  Genitourinary: Negative for dysuria, hematuria and difficulty urinating.  Musculoskeletal: Positive for myalgias and back pain.  Negative for neck pain and neck stiffness.  Skin: Negative for rash and wound.  Neurological: Negative for dizziness, weakness, light-headedness, numbness and headaches.  All other systems reviewed and are negative.     Allergies  Review of patient's allergies indicates no known allergies.  Home Medications   Prior to Admission medications   Medication Sig Start Date End Date Taking? Authorizing Provider  aspirin 81 MG chewable tablet Chew 81 mg by mouth daily.   Yes Historical Provider, MD  capecitabine (XELODA) 500 MG tablet Take 1,000 mg/m2 by mouth 2 (two) times daily after a meal.   Yes Historical Provider, MD  clopidogrel (PLAVIX) 75 MG tablet Take 75 mg by mouth daily.   Yes Historical Provider, MD  dexamethasone (DECADRON) 4 MG tablet Take 4 mg by mouth daily.   Yes Historical Provider, MD  furosemide (LASIX) 40 MG tablet Take 40 mg by mouth daily.   Yes Historical Provider, MD  ipratropium-albuterol (DUONEB) 0.5-2.5 (3) MG/3ML SOLN Take 3 mLs by nebulization every 4 (four) hours as needed (SOB, wheezing).   Yes Historical Provider, MD  oxyCODONE-acetaminophen (PERCOCET) 10-325 MG tablet Take 1 tablet by mouth every 6 (six) hours as needed for pain. 11/21/15   Julianne Rice, MD   BP 159/85 mmHg  Pulse 92  Temp(Src) 98.7 F (37.1 C) (Oral)  Resp 16  SpO2 98% Physical Exam  Constitutional: She is oriented to person, place, and time. She appears well-developed and well-nourished. No distress.  HENT:  Head: Normocephalic and atraumatic.  Mouth/Throat: Oropharynx is clear and moist. No oropharyngeal exudate.  Eyes: EOM are normal. Pupils are equal, round, and reactive to light.  Neck: Normal range of motion. Neck supple.  Cardiovascular: Normal rate and regular rhythm.  Exam reveals no gallop and no friction rub.   No murmur heard. Pulmonary/Chest: Effort normal and breath sounds normal. No respiratory distress. She has no wheezes. She has no rales. She exhibits no  tenderness.  Abdominal: Soft. Bowel sounds are normal. She exhibits no distension and no mass. There is no tenderness. There is no rebound and no guarding.  Musculoskeletal: Normal range of motion. She exhibits edema. She exhibits no tenderness.  Patient has left sided lumbar paraspinal muscle tenderness to palpation. Mild amount of midline lumbar tenderness. There is no step-offs or deformity. She has 1+ bilateral pitting edema. This extends from the feet to the calf region. 2+ dorsalis pedis pulses. No calf asymmetry  Neurological: She is alert and oriented to person, place, and time.  5/5 motor in all extremities. Sensation is fully intact.  Skin: Skin is warm and dry. No rash noted. No erythema.  Psychiatric: She has a normal mood and affect. Her behavior is normal.  Nursing note and vitals reviewed.   ED Course  Procedures (including critical care time) Labs Review Labs Reviewed - No data to display  Imaging Review Dg Lumbar Spine 2-3 Views  11/21/2015  CLINICAL DATA:  Acute onset of lower back pain, worse on the left. Current history of metastatic breast cancer. Initial encounter. EXAM: LUMBAR SPINE - 2-3 VIEW COMPARISON:  Abdominal radiograph performed 01/11/2009 FINDINGS: There is no evidence of acute fracture or subluxation. There is slight chronic loss of height at vertebral bodies T11 and T12, and some degree of irregularity of vertebral body L5. Underlying metastatic lesions cannot be excluded, but are not well assessed on radiograph. Vertebral bodies demonstrate normal alignment. Intervertebral disc spaces are preserved. The visualized bowel gas pattern is unremarkable in appearance; air and stool are noted within the colon. The sacroiliac joints are within normal limits. IMPRESSION: 1. No evidence of acute fracture or subluxation along the lumbar spine. 2. Slight chronic loss of height at vertebral bodies T11 and T12, and some degree of irregularity of vertebral body L5. Underlying  metastatic lesions cannot be excluded, but are not well assessed on radiograph. If the patient has not previously been diagnosed with bony metastases, bone scan could be considered for further evaluation on a nonemergent basis. Electronically Signed   By: Garald Balding M.D.   On: 11/21/2015 20:23   I have personally reviewed and evaluated these images and lab results as part of my medical decision-making.   EKG Interpretation None      MDM   Final diagnoses:  Right-sided low back pain without sciatica  Peripheral edema   Patient is feeling better. She is requesting discharge home. Low suspicion for pyelonephritis. We'll discharge home with prescription for pain medication. She is to follow-up with her primary physician. Return precautions given.     Julianne Rice, MD 11/21/15 2132

## 2016-10-03 ENCOUNTER — Emergency Department (HOSPITAL_COMMUNITY): Payer: Medicare Other

## 2016-10-03 ENCOUNTER — Encounter (HOSPITAL_COMMUNITY): Payer: Self-pay

## 2016-10-03 ENCOUNTER — Emergency Department (HOSPITAL_COMMUNITY)
Admission: EM | Admit: 2016-10-03 | Discharge: 2016-10-03 | Disposition: A | Discharge status: Home / Self Care | Payer: Medicare Other | Attending: Emergency Medicine | Admitting: Emergency Medicine

## 2016-10-03 DIAGNOSIS — I252 Old myocardial infarction: Secondary | ICD-10-CM | POA: Diagnosis not present

## 2016-10-03 DIAGNOSIS — C799 Secondary malignant neoplasm of unspecified site: Secondary | ICD-10-CM

## 2016-10-03 DIAGNOSIS — Z79899 Other long term (current) drug therapy: Secondary | ICD-10-CM | POA: Diagnosis not present

## 2016-10-03 DIAGNOSIS — C7951 Secondary malignant neoplasm of bone: Secondary | ICD-10-CM | POA: Diagnosis not present

## 2016-10-03 DIAGNOSIS — Z955 Presence of coronary angioplasty implant and graft: Secondary | ICD-10-CM | POA: Insufficient documentation

## 2016-10-03 DIAGNOSIS — Z7982 Long term (current) use of aspirin: Secondary | ICD-10-CM | POA: Diagnosis not present

## 2016-10-03 DIAGNOSIS — C50919 Malignant neoplasm of unspecified site of unspecified female breast: Secondary | ICD-10-CM | POA: Diagnosis not present

## 2016-10-03 DIAGNOSIS — Z85118 Personal history of other malignant neoplasm of bronchus and lung: Secondary | ICD-10-CM | POA: Insufficient documentation

## 2016-10-03 DIAGNOSIS — R531 Weakness: Secondary | ICD-10-CM | POA: Diagnosis present

## 2016-10-03 DIAGNOSIS — F17210 Nicotine dependence, cigarettes, uncomplicated: Secondary | ICD-10-CM | POA: Diagnosis not present

## 2016-10-03 LAB — CBC
HCT: 32.3 % — ABNORMAL LOW (ref 36.0–46.0)
HEMOGLOBIN: 10.8 g/dL — AB (ref 12.0–15.0)
MCH: 31.2 pg (ref 26.0–34.0)
MCHC: 33.4 g/dL (ref 30.0–36.0)
MCV: 93.4 fL (ref 78.0–100.0)
PLATELETS: 565 10*3/uL — AB (ref 150–400)
RBC: 3.46 MIL/uL — AB (ref 3.87–5.11)
RDW: 17.6 % — ABNORMAL HIGH (ref 11.5–15.5)
WBC: 12.3 10*3/uL — ABNORMAL HIGH (ref 4.0–10.5)

## 2016-10-03 LAB — BASIC METABOLIC PANEL
Anion gap: 10 (ref 5–15)
BUN: 12 mg/dL (ref 6–20)
CO2: 26 mmol/L (ref 22–32)
CREATININE: 0.53 mg/dL (ref 0.44–1.00)
Calcium: 9.5 mg/dL (ref 8.9–10.3)
Chloride: 102 mmol/L (ref 101–111)
GFR calc non Af Amer: >60 mL/min (ref >60)
Glucose, Bld: 121 mg/dL — ABNORMAL HIGH (ref 65–99)
Potassium: 3.7 mmol/L (ref 3.5–5.1)
SODIUM: 138 mmol/L (ref 135–145)

## 2016-10-03 LAB — TROPONIN I

## 2016-10-03 MED ORDER — OXYCODONE-ACETAMINOPHEN 5-325 MG PO TABS
2.0000 | ORAL_TABLET | Freq: Once | ORAL | Status: AC
  Administered 2016-10-03: 2 via ORAL
  Filled 2016-10-03: qty 2

## 2016-10-03 MED ORDER — OXYCODONE-ACETAMINOPHEN 5-325 MG PO TABS: 1.0000 | ORAL_TABLET | Freq: Once | ORAL | Status: DC

## 2016-10-03 MED ORDER — OXYCODONE-ACETAMINOPHEN 5-325 MG PO TABS
1.0000 | ORAL_TABLET | Freq: Once | ORAL | Status: AC
  Administered 2016-10-03: 1 via ORAL
  Filled 2016-10-03: qty 1

## 2016-10-03 NOTE — ED Triage Notes (Signed)
Pt BIB [facility] from home c/o generalized pain and weakness. Pt has a hx of breast and bone cancer. Additionally, pt is endorsing a cough. She states that she was seen last week at [facility] for the cough and was given prescriptions that she has not filled. Pt reports being on chemo, but has "been feeling too bad to take it." A&Ox4.

## 2016-10-03 NOTE — ED Provider Notes (Signed)
[facility] DEPT Provider Note   CSN: [id number] Arrival date & time: 10/03/16  1312     History   Chief Complaint Chief Complaint  Patient presents with  . Generalized Body Aches  . Weakness    HPI [name] is a 66 y.o. female.  HPI Patient presents the emergency department with complaints of generalized pain and weakness.  She has breast cancer with metastatic spread to her bones.  She is with hospice.  She is being managed at home with Percocet but she ran out of her prescription and could not get a refill until February 8.  She reports cough.  She denies shortness of breath.  No fevers or chills.  Denies abdominal pain or back pain.   Past Medical History:  Diagnosis Date  . Cancer (HCC)    Lung, Breast, and Bone   . MI (myocardial infarction)     There are no active problems to display for this patient.   Past Surgical History:  Procedure Laterality Date  . CARDIAC CATHETERIZATION    . CORONARY ANGIOPLASTY WITH STENT PLACEMENT    . ROTATOR CUFF REPAIR Left     OB History    No data available       Home Medications    Prior to Admission medications   Medication Sig Start Date End Date Taking? Authorizing Provider  acetaminophen (TYLENOL) 500 MG tablet Take 1,000 mg by mouth every 6 (six) hours as needed.   Yes Historical Provider, MD  aspirin 81 MG chewable tablet Chew 81 mg by mouth daily.   Yes Historical Provider, MD  capecitabine (XELODA) 500 MG tablet Take 1,000 mg/m2 by mouth 2 (two) times daily after a meal.   Yes Historical Provider, MD  clopidogrel (PLAVIX) 75 MG tablet Take 75 mg by mouth daily.   Yes Historical Provider, MD  gabapentin (NEURONTIN) 100 MG capsule Take 100 mg by mouth 3 (three) times daily.   Yes Historical Provider, MD  ipratropium-albuterol (DUONEB) 0.5-2.5 (3) MG/3ML SOLN Take 3 mLs by nebulization every 4 (four) hours as needed (SOB, wheezing).   Yes Historical Provider, MD  oxyCODONE-acetaminophen (PERCOCET) 10-325 MG  tablet Take 1 tablet by mouth every 6 (six) hours as needed for pain. 11/21/15  Yes Julianne Rice, MD  Phenylephrine-DM-GG-APAP (TYLENOL COLD MULTI-SYMPTOM) 5-10-200-325 MG TABS Take 1 tablet by mouth every 12 (twelve) hours as needed (cold).   Yes Historical Provider, MD    Family History History reviewed. No pertinent family history.  Social History Social History  Substance Use Topics  . Smoking status: Current Every Day Smoker    Packs/day: 0.50    Types: Cigarettes  . Smokeless tobacco: Not on file  . Alcohol use Yes     Comment: occ     Allergies   Patient has no known allergies.   Review of Systems Review of Systems  All other systems reviewed and are negative.    Physical Exam Updated Vital Signs BP 134/57 (BP Location: Left Arm)   Pulse 94   Temp 99.6 F (37.6 C)   Resp 23   Ht 5\' 5"  (1.651 m)   Wt 145 lb (65.8 kg)   SpO2 94%   BMI 24.13 kg/m   Physical Exam  Constitutional: She is oriented to person, place, and time. She appears well-developed and well-nourished. No distress.  HENT:  Head: Normocephalic and atraumatic.  Eyes: EOM are normal.  Neck: Normal range of motion.  Cardiovascular: Normal rate, regular rhythm and normal heart  sounds.   Pulmonary/Chest: Effort normal and breath sounds normal.  Abdominal: Soft. She exhibits no distension. There is no tenderness.  Musculoskeletal: Normal range of motion.  Neurological: She is alert and oriented to person, place, and time.  Skin: Skin is warm and dry.  Psychiatric: She has a normal mood and affect. Judgment normal.  Nursing note and vitals reviewed.    ED Treatments / Results  Labs (all labs ordered are listed, but only abnormal results are displayed) Labs Reviewed  CBC - Abnormal; Notable for the following:       Result Value   WBC 12.3 (*)    RBC 3.46 (*)    Hemoglobin 10.8 (*)    HCT 32.3 (*)    RDW 17.6 (*)    Platelets 565 (*)    All other components within normal limits    BASIC METABOLIC PANEL - Abnormal; Notable for the following:    Glucose, Bld 121 (*)    All other components within normal limits  TROPONIN I    EKG  EKG Interpretation None       Radiology Dg Chest 2 View  Result Date: 10/03/2016 CLINICAL DATA:  66 year old female with chest pain and cough. History of lung, breast and bone cancer. EXAM: CHEST  2 VIEW COMPARISON:  03/21/2009 radiographs FINDINGS: Mild cardiomegaly and coronary artery stent again noted. Surgical clips overlying the left chest again identified. There is no evidence of airspace disease, consolidation, pulmonary mass or pneumothorax. Left basilar scarring is again noted. Diffuse sclerosis within the visualized bones is compatible with metastatic disease. IMPRESSION: No evidence of acute cardiopulmonary disease. Mild cardiomegaly. Diffuse bony metastases noted. Electronically Signed   By: Margarette Canada M.D.   On: 10/03/2016 14:47    Procedures Procedures (including critical care time)  Medications Ordered in ED Medications  oxyCODONE-acetaminophen (PERCOCET/ROXICET) 5-325 MG per tablet 2 tablet (not administered)  oxyCODONE-acetaminophen (PERCOCET/ROXICET) 5-325 MG per tablet 1 tablet (1 tablet Oral Given 10/03/16 1348)     Initial Impression / Assessment and Plan / ED Course  I have reviewed the triage vital signs and the nursing notes.  Pertinent labs & imaging results that were available during my care of the patient were reviewed by me and considered in my medical decision making (see chart for details).     Diffuse metastatic disease.  Pain likely uncontrolled secondary to running out of her pain medication.  Unfortunately I am unable to prescribe her any additional pain medication.  This will need to come from her primary oncology/hospice team who is writing her these prescriptions.  Her pain was treated in the ER.  No life-threatening illness.  No evidence of pneumonia noted.  Patient understands return to the ER  for new or worsening symptoms  Final Clinical Impressions(s) / ED Diagnoses   Final diagnoses:  Metastatic cancer [facility])    New Prescriptions New Prescriptions   No medications on file     Jola Schmidt, MD 10/03/16 1721

## 2016-10-03 NOTE — ED Notes (Signed)
Pt calling her ride 

## 2016-10-03 NOTE — ED Notes (Signed)
Bed: WA21 Expected date:  Expected time:  Means of arrival:  Comments: 66 yo gen weakness hx of ca

## 2016-12-06 DEATH — deceased

## 2019-07-24 ENCOUNTER — Other Ambulatory Visit: Payer: Self-pay

## 2019-07-24 NOTE — Telephone Encounter (Signed)
Error
